# Patient Record
Sex: Male | Born: 1945 | Race: White | Hispanic: No | Marital: Single | Smoking: Never smoker
Health system: Southern US, Community
[De-identification: ages and names within clinical notes are randomized; demographics above are authoritative.]

## PROBLEM LIST (undated history)

## (undated) DIAGNOSIS — I10 Essential (primary) hypertension: Secondary | ICD-10-CM

---

## 2003-09-22 ENCOUNTER — Inpatient Hospital Stay (HOSPITAL_COMMUNITY): Admission: RE | Admit: 2003-09-22 | Discharge: 2003-09-26 | Payer: Self-pay | Admitting: Orthopedic Surgery

## 2003-10-27 ENCOUNTER — Inpatient Hospital Stay (HOSPITAL_COMMUNITY): Admission: RE | Admit: 2003-10-27 | Discharge: 2003-10-31 | Payer: Self-pay | Admitting: Orthopedic Surgery

## 2004-10-06 ENCOUNTER — Ambulatory Visit (HOSPITAL_BASED_OUTPATIENT_CLINIC_OR_DEPARTMENT_OTHER): Admission: RE | Admit: 2004-10-06 | Discharge: 2004-10-06 | Payer: Self-pay | Admitting: Orthopedic Surgery

## 2004-10-06 ENCOUNTER — Ambulatory Visit (HOSPITAL_COMMUNITY): Admission: RE | Admit: 2004-10-06 | Discharge: 2004-10-06 | Payer: Self-pay | Admitting: Orthopedic Surgery

## 2009-05-28 ENCOUNTER — Encounter: Admission: RE | Admit: 2009-05-28 | Discharge: 2009-05-28 | Payer: Self-pay | Admitting: Gastroenterology

## 2011-03-25 NOTE — Op Note (Signed)
NAME:  Eddie Roberts, Eddie Roberts NO.:  [id number]   MEDICAL RECORD NO.:  [id number]          PATIENT TYPE:  AMB   LOCATION:  DSC                          FACILITY:  [facility]   PHYSICIAN:  Loreta Ave, M.D. DATE OF BIRTH:  07/28/1946   DATE OF PROCEDURE:  10/06/2004  DATE OF DISCHARGE:                                 OPERATIVE REPORT   PREOPERATIVE DIAGNOSIS:  Left ankle degenerative arthritis, chondromalacia,  loose bodies, spurring, and synovitis.  Mild to moderate instability.   POSTOPERATIVE DIAGNOSIS:  Left ankle degenerative arthritis, chondromalacia,  loose bodies, spurring, and synovitis.  Mild to moderate instability.   PROCEDURE:  Left ankle examination under anesthesia, arthroscopy, removal of  loose bodies, partial synovectomy, removal of tibial spurs.  Chondroplasty  of all compartments.   SURGEON:  Loreta Ave, M.D.   ASSISTANT:  Genene Churn. Denton Meek.   ANESTHESIA:  General.   ESTIMATED BLOOD LOSS:  Minimal.   SPECIMENS:  None.   CULTURES:  None.   COMPLICATIONS:  None.   DRESSING:  Soft compressive.   DESCRIPTION OF PROCEDURE:  The patient was brought to the operating room and  after adequate anesthesia had been obtained, a tourniquet was applied and  the stirrup for ankle arthroscopy applied.  Prepped and draped in the usual  sterile fashion.  Exsanguinated with elevation of Esmarch.  Tourniquet  inflated to 350 mmHg.  Anterolateral and anteromedial portals at the ankle.  Ankle was examined.  Some anterior impingement limiting dorsiflexion not  extreme.  Moderate instability of opening about 10 to 11 degrees, certainly  not extreme and not loose enough to require reconstruction.  After the two  portals were created, the ankle was entered with the arthroscope, distended,  and inspected.  Diffuse grade 2 changes.  Some focal grade 3 changes lateral  tibia and the lateral dome of the talus.  All of this was chondroplastied  throughout.   Three large osteochondral loose bodies, one lateral and two  medial were all found and completely removed.  Partially tethered medially  and this was freed up to remove the loose bodies.  Synovitis throughout  especially at the syndesmosis all debrided.  The ankle was then examined and  all recess examined.  All loose bodies had been removed.  All cartilage  surfaces even.  Some impingement and spurring distal tibia anteriorly with  maximum dorsiflexion.  Spur was removed with a bur, tapered down smoothly  with a shaver.  All impingement obliterated.  At completion, the entire  ankle examined and no other findings appreciated.  I looked at all recesses  including way posteriorly and I saw no other loose fragments.  Instruments  and fluid removed.  Ankle injected with Marcaine.  Portals closed with nylon.  Sterile  compressive dressing applied.  Tourniquet inflated and removed.  Anesthesia  reversed.  Brought to the recovery room.  Tolerated the surgery well with no  complications.      Valentino Saxon   DFM/MEDQ  D:  10/06/2004  T:  10/06/2004  Job:  [id number]

## 2011-03-25 NOTE — Discharge Summary (Signed)
NAME:  Eddie Roberts, MANI NO.:  [id number]   MEDICAL RECORD NO.:  [id number]                   PATIENT TYPE:  INP   LOCATION:  5031                                 FACILITY:  [facility]   PHYSICIAN:  Loreta Ave, M.D.              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   PROCEDURE:  Right total knee replacement.   HISTORY:  This is a 65 year old male who is status post left total knee  replacement.  He is having worsening right knee pain with activities of  daily living as well as at nighttime.  He has radiographic end-stage DJD of  his right medial compartment.  He is now indicated for right total knee  replacement.   HOSPITAL COURSE:  This is a 65 year old male who was admitted on October 27, 2003 after appropriate laboratory studies were obtained as well as 1  gram of Ancef IV in the operating room.  He was taken to the operating room  where he underwent a right total knee replacement.  He tolerated the  procedure well.  He was continued on Ancef 1 gram IV q. 8 h times three  doses.  Heparin 5000 units subcutaneous q. 12 hours was begun until his  Coumadin became therapeutic.  Foley was placed intraoperatively.  Consultations with PT and OT were made.  Weightbearing as tolerated on the  right.  CPM 0 to 30 degrees for eight to ten hours per day and this was  increased 10 degrees a day.  Hemovacs were placed intraoperatively.  On the  21st, the CPM was placed from 0 to 90 degrees.  PT was ordered to do range  of motion and strengthening on the left knee.  He was allowed out of bed to  a chair the following day.  His dressing was changed on the 22nd of  December.  Hemovacs were pulled at that  time.  Foley was discontinued.  The  remainder of his hospital course was uneventful.  He was placed on OxyContin  CR 20 mg p.o. q. 12 hours on the 23rd of December.  He was discharged on the  24th of December to follow back up with [place] in 10 to 12 days for recheck  evaluation and staple removal.   Radiographic studies revealed satisfactory position and alignment of a right  total knee replacement.  Preoperative laboratory studies; hemoglobin 14.0,  hematocrit 41.2%, white count 7,200, platelets 290,000.  Discharge  hemoglobin 8.8, hematocrit 25.9%, white count 7,300, platelets 201,000.  Protime at time of discharge was 17.4 with an INR of 1.7.  Preoperative  sodium 141, potassium 4.3, chloride 103, CO2  29, glucose 97, BUN 10,  creatinine 1.0, calcium 9.5, total protein 7.5, albumin 3.8, AST 17, ALT 13,  ALP 91, total bilirubin 0.5.  Discharge sodium 139, potassium 4.2, chloride  105, CO2 29, glucose 135, BUN 9, creatinine 0.9, calcium 8.6.  Urinalysis  was benign for voided urine, though there were 0:2 red cells and calcium  oxalate crystals were noted.  Blood type was O-.  Antibody screen negative.   DISCHARGE INSTRUCTIONS:  He was given a prescription for OxyContin CR 20 mg  one p.o. q. 12 hours, Percocet 5/325 one to two every four hours as needed  for pain, Coumadin 5 mg as directed by advanced home therapy, Colace 100 mg  twice a day, iron sulfate 325 mg twice a day.  Activity as per physical  therapy.  No restrictions on diet.  He will keep his wound clean and dry and  covered with a dressing daily.  Follow blue  instruction sheet.  CPM on the left six hours per day starting at 90  degrees.  CPM on the right eight to ten hours a day increasing 10 degrees a  day.  He will follow back up with [place] in 10 to 12 days for a recheck  evaluation.  Discharged in improved condition.      Oris Drone Petrarca, P.A.-C.                Loreta Ave, M.D.    BDP/MEDQ  D:  12/15/2003  T:   12/16/2003  Job:  [id number]

## 2011-03-25 NOTE — Op Note (Signed)
NAME:  Eddie Roberts, Eddie Roberts NO.:  [id number]   MEDICAL RECORD NO.:  [id number]                   PATIENT TYPE:  INP   LOCATION:  5031                                 FACILITY:  [facility]   PHYSICIAN:  Loreta Ave, M.D.              DATE OF BIRTH:  1946-08-29   DATE OF PROCEDURE:  10/27/2003  DATE OF DISCHARGE:                                 OPERATIVE REPORT   PREOPERATIVE DIAGNOSIS:  End stage degenerative arthritis, right knee with  varus alignment, mild flexion contracture.   POSTOPERATIVE DIAGNOSIS:  End stage degenerative arthritis, right knee with  varus alignment, mild flexion contracture.   OPERATION PERFORMED:  Right total knee replacement with soft tissue  balancing.  Cemented #11 posterior stabilized femoral component.  Cemented  #11 tibial component with 12 mm posterior stabilized flexed insert.  Cemented recessed medialized patellar components, 30 mm.   SURGEON:  Loreta Ave, M.D.   ASSISTANT:  Arlys John D. Petrarca, P.A.-C.   ANESTHESIA:  General.   ESTIMATED BLOOD LOSS:  Minimal.   TOURNIQUET TIME:  One hour 20 minutes.   SPECIMENS:  Excised bone and soft tissue.   CULTURES:  None.   COMPLICATIONS:  None.   DRAINS:  Hemovac times 2.   DRESSING:  Soft compressive with knee immobilizer.   DESCRIPTION OF PROCEDURE:  The patient was brought to the operating room and  placed on the operating table in supine position.  After adequate anesthesia  had been obtained, both knees examined.  Recent total knee on the left.  This was gently manipulated to aid with therapy.  He had full extension,  nicely balanced knee and with some gentle manipulation, we got him back past  120 degrees of flexion.  Attention was then turned to the right.  5 degree  flexion contracture, alignment 5 degrees varus, correctable to neutral.  Further flexion to 100 degrees.  Tourniquet applied.  Leg prepped and draped  in the usual sterile fashion.   Exsanguinated with elevation and Esmarch.  Tourniquet inflated to 350 mmHg.  Anterior incision above the patella down  to the tibial tubercle. Ski and subcutaneous tissues divided.  Hemostasis  with cautery.  Medial parapatellar arthrotomy.  Clear effusion.  Knee  exposed.  Grade 4 changes medially with a little bit lesser extent in the  other compartments.  Remnants of menisci, cruciate ligaments removed as well  as hypertrophic synovial tissue.  Medial capsular release.  Distal femur  exposed.  Intramedullary  guide placed.  Distal cut set at 5 degrees of  valgus  removing 10 mm.  Sized to a #11 component.  Jigs put in place,  definitive cuts made.  Trial put in place and found to fit well.  Attention  was turned to the tibia.  Tibial spine removed with a saw.  The  intramedullary guide placed.  Proximal cut removing 6 mm, 5 degree posterior  slope cut with the intramedullary guide.  Patella sized, reamed and drilled  for the 30 mm component.  Because of his increased Q-angle, I medialized  patellar component to improve tracking.  Trials put in place throughout.  A  #11 on the femur, #11 on the tibia, #30 mm on the patella.  With a 12 mm  tibial insert, full extension, full flexion, nicely balanced knee set at 5  degrees of valgus, with good stability and no lift off in flexion.  The  tibia was marked for appropriate rotation and hand reamed.  All trials  removed.  Knee was copiously irrigated with a pulse irrigating device.  Cement prepared and placed on all components which were firmly seated and  polyethylene attached to the tibial component.  Knee reduced.  Once the  cement had hardened, the knee was re-examined.  Full extension, full  flexion, good patellofemoral tracking, good stability.  Hemovacs placed,  brought out through separate stab wounds.  Arthrotomy closed with #1 Vicryl.  Skin and subcutaneous tissue withy Vicryl and staples.  Margins of the wound  and the knee injected  with Marcaine and Hemovac clamped. Sterile compressive  dressing applied.  Anesthesia reversed.  Brought to recovery room.  Tolerated surgery well.  No complications.                                               Loreta Ave, M.D.    DFM/MEDQ  D:  10/27/2003  T:  10/27/2003  Job:  [id number]

## 2011-03-25 NOTE — Op Note (Signed)
NAME:  Eddie Roberts, SKODA NO.:  [id number]   MEDICAL RECORD NO.:  [id number]                   PATIENT TYPE:  INP   LOCATION:  5004                                 FACILITY:  [facility]   PHYSICIAN:  Loreta Ave, M.D.              DATE OF BIRTH:  01/16/1946   DATE OF PROCEDURE:  09/24/2003  DATE OF DISCHARGE:                                 OPERATIVE REPORT   PREOPERATIVE DIAGNOSIS:  End stage degenerative arthritis, varus alignment,  and flexion contracture, left knee.   POSTOPERATIVE DIAGNOSIS:  End stage degenerative arthritis, varus alignment,  and flexion contracture, left knee.   OPERATIVE PROCEDURE:  Left total knee replacement Osteonix prosthesis.  Soft  tissue balancing including lateral retinacular release and medial capsule  release.  Cemented number 11 posterior stabilized femoral component.  Cemented number 11 tibial component with 12 mm posterior stabilized flexed  polyethylene insert.  Cemented recessed 30 mm patellar component.   SURGEON:  Loreta Ave, M.D.   ASSISTANT:  Arlys John D. Petrarca, P.A.-C.   ANESTHESIA:  General.   ESTIMATED BLOOD LOSS:  Minimal.   TOURNIQUET TIME:  1 hour 20 minutes.   SPECIMENS:  Excised bone and soft tissue.   CULTURES:  None.   COMPLICATIONS:  None.   DRAINS:  Hemovac x 1.   PROCEDURE:  The patient was brought to the operating room and after adequate  anesthesia had been obtained, the left knee was examined.  More than 5  degree flexion contracture, more than 5 degrees of varus without much  correction, further flexion 100 degrees, stable ligaments.  Tourniquet  applied, prepped and draped in the usual sterile fashion.  Exsanguination  with elevation of Esmarch and tourniquet inflated to 350 mmHg.  A straight  incision above the patella down to the tibial tubercle.  Medial parapatellar  arthrotomy.  Hemostasis cautery.  Medial capsular release.  Grade 4 changes  throughout.  Remnants of  menisci, cruciate ligaments, periarticular spurs,  loose bodies, all removed.  Distal femur exposed.  Intramedullary guide  placed.  Distal cut removing 10 mm set at 5 mm valgus.  Sized for number 11  component.  Jigs put in place and definitive cuts made.  Tibial spine  removed with a saw.  Proximal tibial cut removing 6 mm off the deficient  side, 5 degree posterior slope cut with intramedullary guide.  Sized for  number 11 component.  Patella sized, reamed, and drilled for a 30 mm  component.  Trials put in place throughout.  With the 12 mm polyethylene  insert, I had good stability, good alignment set at 5 degrees of valgus  after medial capsular release.  Full extension and full flexion without lift  off.  Lateral release necessary to balance the patellofemoral joint and once  performed, I had good tracking.  All trials were removed after the tibia was  marked for appropriate rotation.  The tibia was hand reamed.  Copious  irrigation with the pulse irrigating device.  Cement prepared and placed on  all components which were firmly seated.  Polyethylene attached to the tibia  and the knee reduced. Once the cement hardened, it was re-examined.  Full  extension, full flexion, no lift off, good patellofemoral tracking, good  stability.  Two Hemovacs placed and brought out through separate stab  wounds.  Arthrotomy closed with #1 Vicryl, skin and subcutaneous tissue with  Vicryl and staples.  Margins of the wound and knee injected with Marcaine  and a Hemovac placed.  Sterile compressive dressing applied.  Tourniquet  deflated and removed.  Knee immobilizer applied.  Anesthesia reversed.  Brought to the recovery room.  Tolerated the surgery well without  complications.                                               Loreta Ave, M.D.    DFM/MEDQ  D:  09/24/2003  T:  09/24/2003  Job:  [id number]

## 2011-03-25 NOTE — Discharge Summary (Signed)
NAME:  Eddie Roberts, Eddie Roberts NO.:  [id number]   MEDICAL RECORD NO.:  [id number]                   PATIENT TYPE:  INP   LOCATION:  5004                                 FACILITY:  [facility]   PHYSICIAN:  Loreta Ave, M.D.              DATE OF BIRTH:  [birth year]   DATE OF ADMISSION:  09/22/2003  DATE OF DISCHARGE:  09/26/2003                                 DISCHARGE SUMMARY   ADMISSION DIAGNOSIS:  Advanced degenerative joint disease of the left knee.   DISCHARGE DIAGNOSIS:  Advanced degenerative joint disease of the left knee.   PROCEDURE:  Left total knee replacement.   HISTORY:  A 65 year old male with advanced DJD of the knee with progressive  pain, stiffness, and deformity. He has been tried with conservative  treatment including cortisone injections with only transient relief. He is  not having pains with activities of daily living. He is indicated to right  total knee replacement secondary to advanced DJD of the knee.   HOSPITAL COURSE:  A 65 year old male admitted September 22, 2003 after  appropriate laboratory studies were obtained. Was taken to the operating  room where he underwent a left total knee replacement. He was placed  preoperatively on Ancef 1 g IV and postoperative 1 g IV q.8h. for three  doses. Heparin 5,000 units subcu q.12h. was begun until his Coumadin level  became therapeutic. A PCA Dilaudid high dose protocol PCA pump was used.  Consults with PT and OT. Ambulation weight bearing as tolerated. CPM 0 to 30  degrees 8 to 10 hours per day. Foley was placed intraoperatively. Hemovac  drains were placed intraoperatively. His Hemovac had been released four  hours afterwards and apparently had a fairly large blood output. His Hemovac  was reclamped. Hemoglobin A1c was ordered. He was allowed out of bed to  chair the following day. His dressings were changed on November 17, and his  Hemovac drains were pulled. He had an otherwise  unremarkable hospital  course, and he was discharged home on November 19 with guaiac stool cards  that he was to obtain and bring to the office. Discharged in improved  condition. EKG was normal sinus rhythm.   LABORATORY DATA:  Admitted with a hemoglobin of 14.5, hematocrit 43.3%,  white count 6,900, platelets 246,000. Discharge hemoglobin 10.0, hematocrit  29.3%, white count 6,700, platelets 171,000. Discharge protime 19.5 with an  INR of 2.1. Preoperative chemistries:  Sodium 138, potassium 4.5, chloride  104, CO2 27, glucose 94, BUN 8, creatinine 1.0; calcium 9.1, total protein  6.5, albumin 3.9, AST 15, ALT 14, ALP 62, total bilirubin 0.5. Discharge  sodium 138, potassium 3.8, chloride 102, CO2 31, glucose 128, BUN 6,  creatinine 0.9, and calcium 8.2. hemoglobin A1c is 5.2. Urinalysis:  Benign  for a void urine. Blood type O negative, antibody screen negative.   DISCHARGE INSTRUCTIONS:  1. Given a prescription for  Percocet 5/325 one to two tablets every four     hours as needed for pain.  2. Coumadin 5 mg one and a half tablets on Monday, Wednesday, Friday, and     Sunday; one tablet on Tuesday, Saturday, and Thursday.  3. Iron sulfate 325 mg b.i.d. with food.  4. Colace 100 mg twice a day.   ACTIVITY:  As tolerated with physical therapy.   DIET:  No restrictions in diet.   WOUND CARE:  Keep his wounds clean and dry and cover daily with a dressing.  Follow blue instruction sheet.   FOLLOW UP:  Follow back up in the office in the 10 to 14 days for recheck  evaluation.   CONDITION ON DISCHARGE:  Discharged in improved condition.      Oris Drone Petrarca, P.A.-C.                Loreta Ave, M.D.    BDP/MEDQ  D:  10/20/2003  T:  10/21/2003  Job:  [id number]

## 2014-03-22 ENCOUNTER — Ambulatory Visit (INDEPENDENT_AMBULATORY_CARE_PROVIDER_SITE_OTHER): Payer: Medicare Other | Admitting: Internal Medicine

## 2014-03-22 ENCOUNTER — Ambulatory Visit: Payer: Medicare Other

## 2014-03-22 VITALS — BP 140/80 | HR 97 | Temp 100.6°F | Ht 71.0 in | Wt 295.0 lb

## 2014-03-22 DIAGNOSIS — R51 Headache: Secondary | ICD-10-CM

## 2014-03-22 DIAGNOSIS — R509 Fever, unspecified: Secondary | ICD-10-CM

## 2014-03-22 DIAGNOSIS — J320 Chronic maxillary sinusitis: Secondary | ICD-10-CM

## 2014-03-22 LAB — POCT CBC
Granulocyte percent: 84.5 %G — AB (ref 37–80)
HCT, POC: 40.2 % — AB (ref 43.5–53.7)
Hemoglobin: 12.7 g/dL — AB (ref 14.1–18.1)
Lymph, poc: 1 (ref 0.6–3.4)
MCH, POC: 29.6 pg (ref 27–31.2)
MCHC: 31.6 g/dL — AB (ref 31.8–35.4)
MCV: 93.8 fL (ref 80–97)
MID (cbc): 0.6 (ref 0–0.9)
MPV: 7.4 fL (ref 0–99.8)
POC Granulocyte: 8.4 — AB (ref 2–6.9)
POC LYMPH PERCENT: 9.8 %L — AB (ref 10–50)
POC MID %: 5.7 %M (ref 0–12)
Platelet Count, POC: 304 10*3/uL (ref 142–424)
RBC: 4.29 M/uL — AB (ref 4.69–6.13)
RDW, POC: 14 %
WBC: 9.9 10*3/uL (ref 4.6–10.2)

## 2014-03-22 LAB — POCT URINALYSIS DIPSTICK
Bilirubin, UA: NEGATIVE
Glucose, UA: NEGATIVE
Leukocytes, UA: NEGATIVE
Nitrite, UA: NEGATIVE
Protein, UA: 30
Spec Grav, UA: 1.02
Urobilinogen, UA: 1
pH, UA: 6

## 2014-03-22 LAB — POCT UA - MICROSCOPIC ONLY
Bacteria, U Microscopic: NEGATIVE
Casts, Ur, LPF, POC: NEGATIVE
Crystals, Ur, HPF, POC: NEGATIVE
Yeast, UA: NEGATIVE

## 2014-03-22 LAB — GLUCOSE, POCT (MANUAL RESULT ENTRY): POC Glucose: 121 mg/dl — AB (ref 70–99)

## 2014-03-22 MED ORDER — DOXYCYCLINE HYCLATE 100 MG PO TABS: 100.0000 mg | ORAL_TABLET | Freq: Two times a day (BID) | ORAL | Status: AC

## 2014-03-22 MED ORDER — CEFTRIAXONE SODIUM 1 G IJ SOLR
1.0000 g | Freq: Once | INTRAMUSCULAR | Status: AC
  Administered 2014-03-22: 1 g via INTRAMUSCULAR

## 2014-03-22 NOTE — Patient Instructions (Addendum)
Fever, Adult A fever is a higher than normal body temperature. In an adult, an oral temperature around 98.6 F (37 C) is considered normal. A temperature of 100.4 F (38 C) or higher is generally considered a fever. Mild or moderate fevers generally have no long-term effects and often do not require treatment. Extreme fever (greater than or equal to 106 F or 41.1 C) can cause seizures. The sweating that may occur with repeated or prolonged fever may cause dehydration. Elderly people can develop confusion during a fever. A measured temperature can vary with:  Age.  Time of day.  Method of measurement (mouth, underarm, rectal, or ear). The fever is confirmed by taking a temperature with a thermometer. Temperatures can be taken different ways. Some methods are accurate and some are not.  An oral temperature is used most commonly. Electronic thermometers are fast and accurate.  An ear temperature will only be accurate if the thermometer is positioned as recommended by the manufacturer.  A rectal temperature is accurate and done for those adults who have a condition where an oral temperature cannot be taken.  An underarm (axillary) temperature is not accurate and not recommended. Fever is a symptom, not a disease.  CAUSES   Infections commonly cause fever.  Some noninfectious causes for fever include:  Some arthritis conditions.  Some thyroid or adrenal gland conditions.  Some immune system conditions.  Some types of cancer.  A medicine reaction.  High doses of certain street drugs such as methamphetamine.  Dehydration.  Exposure to high outside or room temperatures.  Occasionally, the source of a fever cannot be determined. This is sometimes called a "fever of unknown origin" (FUO).  Some situations may lead to a temporary rise in body temperature that may go away on its own. Examples are:  Childbirth.  Surgery.  Intense exercise. HOME CARE INSTRUCTIONS   Take  appropriate medicines for fever. Follow dosing instructions carefully. If you use acetaminophen to reduce the fever, be careful to avoid taking other medicines that also contain acetaminophen. Do not take aspirin for a fever if you are younger than age 19. There is an association with Reye's syndrome. Reye's syndrome is a rare but potentially deadly disease.  If an infection is present and antibiotics have been prescribed, take them as directed. Finish them even if you start to feel better.  Rest as needed.  Maintain an adequate fluid intake. To prevent dehydration during an illness with prolonged or recurrent fever, you may need to drink extra fluid.Drink enough fluids to keep your urine clear or pale yellow.  Sponging or bathing with room temperature water may help reduce body temperature. Do not use ice water or alcohol sponge baths.  Dress comfortably, but do not over-bundle. SEEK MEDICAL CARE IF:   You are unable to keep fluids down.  You develop vomiting or diarrhea.  You are not feeling at least partly better after 3 days.  You develop new symptoms or problems. SEEK IMMEDIATE MEDICAL CARE IF:   You have shortness of breath or trouble breathing.  You develop excessive weakness.  You are dizzy or you faint.  You are extremely thirsty or you are making little or no urine.  You develop new pain that was not there before (such as in the head, neck, chest, back, or abdomen).  You have persistant vomiting and diarrhea for more than 1 to 2 days.  You develop a stiff neck or your eyes become sensitive to light.  You develop a   skin rash.  You have a fever or persistent symptoms for more than 2 to 3 days.  You have a fever and your symptoms suddenly get worse. MAKE SURE YOU:   Understand these instructions.  Will watch your condition.  Will get help right away if you are not doing well or get worse. Document Released: 04/19/2001 Document Revised: 01/16/2012 Document  Reviewed: 08/25/2011 [facility] Patient Information 2014 [place], [place]. [place] Spotted Fever [place] Spotted Fever (RMSF) is the oldest known tick-borne disease of people in the [place]. This disease was named because it was first described among people in the [facility] area who had an illness characterized by a rash with red-purple-black spots. This disease is caused by a rickettsia (Rickettsia rickettsii), a bacteria carried by the tick. [facility] wood tick and the American dog tick, acquire and transmit the RMSF bacteria (pictures NOT actual size). When a larval, nymphal or adult tick feeds on an infected rodent or larger animal, the tick can become infected. Infected adult ticks then feed on people who may then get RMSF. The tick transmits the disease to humans during a prolonged period of feeding that lasts many hours, days or even a couple weeks. The bite is painless and frequently goes unnoticed. An infected male tick may also pass the rickettsial bacteria to her eggs that then may mature to be infected adult ticks. The rickettsia that causes RMSF can also get into a person's body through damaged skin. A tick bite is not necessary. People can get RMSF if they crush a tick and get it's blood or body fluids on their skin through a small cut or sore.  DIAGNOSIS Diagnosis is made by laboratory tests.  TREATMENT Treatment is with antibiotics (medications that kill rickettsia and other bacteria). Immediate treatment usually prevents death. GEOGRAPHIC RANGE This disease was reported only in the [facility] until 1931. RMSF has more recently been described among individuals in all states except [place], [place] and [place]. The highest reported incidences of RMSF now occur among residents of [place], [place], [place] and the [place]. TIME OF YEAR  Most cases are diagnosed during late spring and summer when ticks are most active. However, especially in the warmer  [place] states, a few cases occur during the winter. SYMPTOMS  Symptoms of RMSF begin from 2 to 14 days after a tick bite. The most common early symptoms are fever, muscle aches and headache followed by nausea (feeling sick to your stomach) or vomiting. The RMSF rash is typically delayed until 3 or more days after symptom onset, and eventually develops in 9 of 10 infected patients by the 5th day of illness. If the disease is not treated it can cause death. If you get a fever, headache, muscle aches, rash, nausea or vomiting within 2 weeks of a possible tick bite or exposure you should see your caregiver immediately. PREVENTION Ticks prefer to hide in shady, moist ground litter. They can often be found above the ground clinging to tall grass, brush, shrubs and low tree branches. They also inhabit lawns and gardens, especially at the edges of woodlands and around old stone walls. Within the areas where ticks generally live, no naturally vegetated area can be considered completely free of infected ticks. The best precaution against RMSF is to avoid contact with soil, leaf litter and vegetation as much as possible in tick infested areas. For those who enjoy gardening or walking in their yards, clear brush and mow tall grass around houses and  at the edges of gardens. This may help reduce the tick population in the immediate area. Applications of chemical insecticides by a licensed professional in the spring (late May) and Fall (September) will also control ticks, especially in heavily infested areas. Treatment will never get rid of all the ticks. Getting rid of small animal populations that host ticks will also decrease the tick population. When working in the garden, [facility], or handling soil and vegetation, wear light-colored protective clothing and gloves. Spot-check often to prevent ticks from reaching the skin. Ticks cannot jump or fly. They will not drop from an above-ground perch onto a passing  animal. Once a tick gains access to human skin it climbs upward until it reaches a more protected area. For example, the back of the knee, groin, navel, armpit, ears or nape of the neck. It then begins the slow process of embedding itself in the skin. Campers, hikers, field workers, and others who spend time in wooded, brushy or tall grassy areas can avoid exposure to ticks by using the following precautions: Wear light-colored clothing with a tight weave to spot ticks more easily and prevent contact with the skin. Wear long pants tucked into socks, long-sleeved shirts tucked into pants and enclosed shoes or boots along with insect repellent. Spray clothes with insect repellent containing either DEET or Permethrin. Only DEET can be used on exposed skin. Follow the manufacturer's directions carefully. Wear a hat and keep long hair pulled back. Stay on cleared, well-worn trails whenever possible. Spot-check yourself and others often for the presence of ticks on clothes. If you find one, there are likely to be others. Check thoroughly. Remove clothes after leaving tick-infested areas. If possible, wash them to eliminate any unseen ticks. Check yourself, your children and any pets from head to toe for the presence of ticks. Shower and shampoo. You can greatly reduce your chances of contracting RMSF if you remove attached ticks as soon as possible. Regular checks of the body, including all body sites covered by hair (head, armpits, genitals), allow removal of the tick before rickettsial transmission. To remove an attached tick, use a forceps or tweezers to detach the intact tick without leaving mouth parts in the skin. The tick bite wound should be cleansed after tick removal. Remember the most common symptoms of RMSF are fever, muscle aches, headache and nausea or vomiting with a later onset of rash. If you get these symptoms after a tick bite and while living in an area where RMSF is found, RMSF should be  suspected. If the disease is not treated, it can cause death. See your caregiver immediately if you get these symptoms. Do this even if not aware of a tick bite. Document Released: 02/05/2001 Document Revised: 01/16/2012 Document Reviewed: 09/28/2009 [facility] Patient Information 2014 [place], [place]. Sinusitis Sinusitis is redness, soreness, and swelling (inflammation) of the paranasal sinuses. Paranasal sinuses are air pockets within the bones of your face (beneath the eyes, the middle of the forehead, or above the eyes). In healthy paranasal sinuses, mucus is able to drain out, and air is able to circulate through them by way of your nose. However, when your paranasal sinuses are inflamed, mucus and air can become trapped. This can allow bacteria and other germs to grow and cause infection. Sinusitis can develop quickly and last only a short time (acute) or continue over a long period (chronic). Sinusitis that lasts for more than 12 weeks is considered chronic.  CAUSES  Causes of sinusitis include: Allergies.  Structural abnormalities, such as displacement of the cartilage that separates your nostrils (deviated septum), which can decrease the air flow through your nose and sinuses and affect sinus drainage. Functional abnormalities, such as when the small hairs (cilia) that line your sinuses and help remove mucus do not work properly or are not present. SYMPTOMS  Symptoms of acute and chronic sinusitis are the same. The primary symptoms are pain and pressure around the affected sinuses. Other symptoms include: Upper toothache. Earache. Headache. Bad breath. Decreased sense of smell and taste. A cough, which worsens when you are lying flat. Fatigue. Fever. Thick drainage from your nose, which often is green and may contain pus (purulent). Swelling and warmth over the affected sinuses. DIAGNOSIS  Your caregiver will perform a physical exam. During the exam, your caregiver may: Look in your  nose for signs of abnormal growths in your nostrils (nasal polyps). Tap over the affected sinus to check for signs of infection. View the inside of your sinuses (endoscopy) with a special imaging device with a light attached (endoscope), which is inserted into your sinuses. If your caregiver suspects that you have chronic sinusitis, one or more of the following tests may be recommended: Allergy tests. Nasal culture A sample of mucus is taken from your nose and sent to a lab and screened for bacteria. Nasal cytology A sample of mucus is taken from your nose and examined by your caregiver to determine if your sinusitis is related to an allergy. TREATMENT  Most cases of acute sinusitis are related to a viral infection and will resolve on their own within 10 days. Sometimes medicines are prescribed to help relieve symptoms (pain medicine, decongestants, nasal steroid sprays, or saline sprays).  However, for sinusitis related to a bacterial infection, your caregiver will prescribe antibiotic medicines. These are medicines that will help kill the bacteria causing the infection.  Rarely, sinusitis is caused by a fungal infection. In theses cases, your caregiver will prescribe antifungal medicine. For some cases of chronic sinusitis, surgery is needed. Generally, these are cases in which sinusitis recurs more than 3 times per year, despite other treatments. HOME CARE INSTRUCTIONS  Drink plenty of water. Water helps thin the mucus so your sinuses can drain more easily. Use a humidifier. Inhale steam 3 to 4 times a day (for example, sit in the bathroom with the shower running). Apply a warm, moist washcloth to your face 3 to 4 times a day, or as directed by your caregiver. Use saline nasal sprays to help moisten and clean your sinuses. Take over-the-counter or prescription medicines for pain, discomfort, or fever only as directed by your caregiver. SEEK IMMEDIATE MEDICAL CARE IF: You have increasing pain  or severe headaches. You have nausea, vomiting, or drowsiness. You have swelling around your face. You have vision problems. You have a stiff neck. You have difficulty breathing. MAKE SURE YOU:  Understand these instructions. Will watch your condition. Will get help right away if you are not doing well or get worse. Document Released: 10/24/2005 Document Revised: 01/16/2012 Document Reviewed: 11/08/2011 [facility] Patient Information 2014 [place], [place].

## 2014-03-22 NOTE — Progress Notes (Signed)
Subjective:    Patient ID: Eddie Roberts, male    DOB: [birth year], 68 y.o.   MRN: [id number]  HPI  68 year old caucasian male presents to [facility] with fever of 102+ since Sunday.  Pt admits to problem with constipation with relief on Tuesday.   Pt denies sore throat, denies cough, congestion or nasal symptoms.  Pt admits to frontal headache.  Pt feels he has similar symptoms of a sinus infection that happened a few years ago.  Pt admits to seasonal allergies.  Does have pets.  Pt has no rash or chest pain, no cough, no shortness of breath, no diarrhea, no vomiting.  No frequency, urgency or burning with urination.   Pt has checked himself for ticks when he has been outside in the park.  Pt admitted to abdominal pain with the constipation but feels those symptoms have subsided with relief of constipation.  Date of LBM was this am and normal, pain free.  Pt admits to intentional weight loss from 320 lbs one year ago and today weighed 295 lbs.       Review of Systems     Objective:   Physical Exam  Constitutional: He is oriented to person, place, and time. He appears well-nourished. No distress.  HENT:  Head: Normocephalic.  Right Ear: External ear normal.  Left Ear: External ear normal.  Nose: Mucosal edema and rhinorrhea present. No sinus tenderness. Right sinus exhibits no maxillary sinus tenderness and no frontal sinus tenderness. Left sinus exhibits no maxillary sinus tenderness and no frontal sinus tenderness.  Mouth/Throat: Oropharynx is clear and moist.  Eyes: Conjunctivae and EOM are normal. Pupils are equal, round, and reactive to light.  Neck: Normal range of motion. Neck supple.  Cardiovascular: Normal rate, regular rhythm and normal heart sounds.   Pulmonary/Chest: Effort normal and breath sounds normal.  Abdominal: Soft. Bowel sounds are normal. He exhibits no distension. There is no tenderness. There is no rebound and no guarding.  Musculoskeletal: Normal range  of motion.  Lymphadenopathy:    He has no cervical adenopathy.  Neurological: He is alert and oriented to person, place, and time. No cranial nerve deficit. He exhibits normal muscle tone. Coordination normal.  Skin: Skin is warm and dry. No rash noted.  Psychiatric: He has a normal mood and affect. His behavior is normal. Judgment and thought content normal.   Appears well  [facility] reading (PRIMARY) by  Dr Elder Cyphers right maxillary clouding  Consider sinusitis or tic fever Results for orders placed in visit on 03/22/14  POCT URINALYSIS DIPSTICK      Result Value Ref Range   Color, UA yellow     Clarity, UA clear     Glucose, UA neg     Bilirubin, UA neg     Ketones, UA trace     Spec Grav, UA 1.020     Blood, UA trace     pH, UA 6.0     Protein, UA 30     Urobilinogen, UA 1.0     Nitrite, UA neg     Leukocytes, UA Negative    POCT UA - MICROSCOPIC ONLY      Result Value Ref Range   WBC, Ur, HPF, POC 0-3     RBC, urine, microscopic 0-4     Bacteria, U Microscopic neg     Mucus, UA large     Epithelial cells, urine per micros 0-2     Crystals, Ur, HPF, POC neg  Casts, Ur, LPF, POC neg     Yeast, UA neg    GLUCOSE, POCT (MANUAL RESULT ENTRY)      Result Value Ref Range   POC Glucose 121 (*) 70 - 99 mg/dl  POCT CBC      Result Value Ref Range   WBC 9.9  4.6 - 10.2 K/uL   Lymph, poc 1.0  0.6 - 3.4   POC LYMPH PERCENT 9.8 (*) 10 - 50 %L   MID (cbc) 0.6  0 - 0.9   POC MID % 5.7  0 - 12 %M   POC Granulocyte 8.4 (*) 2 - 6.9   Granulocyte percent 84.5 (*) 37 - 80 %G   RBC 4.29 (*) 4.69 - 6.13 M/uL   Hemoglobin 12.7 (*) 14.1 - 18.1 g/dL   HCT, POC 40.2 (*) 43.5 - 53.7 %   MCV 93.8  80 - 97 fL   MCH, POC 29.6  27 - 31.2 pg   MCHC 31.6 (*) 31.8 - 35.4 g/dL   RDW, POC 14.0     Platelet Count, POC 304  142 - 424 K/uL   MPV 7.4  0 - 99.8 fL        Assessment & Plan:  Maxillary sinusitis Cover tick fever Rocephin 1g/Doxycycline 100mg  bid

## 2014-03-25 LAB — ROCKY MTN SPOTTED FVR AB, IGM-BLOOD: ROCKY MTN SPOTTED FEVER, IGM: 0.31 IV

## 2014-03-27 ENCOUNTER — Encounter: Payer: Self-pay | Admitting: *Deleted

## 2015-02-06 ENCOUNTER — Other Ambulatory Visit: Payer: Self-pay | Admitting: Physician Assistant

## 2015-02-06 DIAGNOSIS — R1011 Right upper quadrant pain: Secondary | ICD-10-CM

## 2015-02-06 DIAGNOSIS — R17 Unspecified jaundice: Secondary | ICD-10-CM

## 2015-02-09 ENCOUNTER — Ambulatory Visit
Admission: RE | Admit: 2015-02-09 | Discharge: 2015-02-09 | Disposition: A | Discharge status: Home / Self Care | Payer: Medicare Other | Source: Ambulatory Visit | Attending: Physician Assistant | Admitting: Physician Assistant

## 2015-02-09 DIAGNOSIS — R1011 Right upper quadrant pain: Secondary | ICD-10-CM

## 2015-02-09 DIAGNOSIS — R17 Unspecified jaundice: Secondary | ICD-10-CM

## 2015-02-11 ENCOUNTER — Other Ambulatory Visit: Payer: Self-pay | Admitting: Physician Assistant

## 2015-02-11 DIAGNOSIS — R1011 Right upper quadrant pain: Secondary | ICD-10-CM

## 2015-02-12 ENCOUNTER — Ambulatory Visit
Admission: RE | Admit: 2015-02-12 | Discharge: 2015-02-12 | Disposition: A | Discharge status: Home / Self Care | Payer: Medicare Other | Source: Ambulatory Visit | Attending: Physician Assistant | Admitting: Physician Assistant

## 2015-02-12 DIAGNOSIS — R1011 Right upper quadrant pain: Secondary | ICD-10-CM

## 2015-02-12 MED ORDER — IOPAMIDOL (ISOVUE-300) INJECTION 61%
125.0000 mL | Freq: Once | INTRAVENOUS | Status: AC | PRN
  Administered 2015-02-12: 125 mL via INTRAVENOUS

## 2015-02-13 ENCOUNTER — Telehealth: Payer: Self-pay | Admitting: *Deleted

## 2015-02-13 ENCOUNTER — Inpatient Hospital Stay (HOSPITAL_COMMUNITY)
Admission: EM | Admit: 2015-02-13 | Discharge: 2015-02-19 | DRG: 435 | Disposition: A | Discharge status: Other Acute Inpatient Hospital | Payer: Medicare Other | Attending: Internal Medicine | Admitting: Internal Medicine

## 2015-02-13 ENCOUNTER — Emergency Department (HOSPITAL_COMMUNITY): Payer: Medicare Other

## 2015-02-13 ENCOUNTER — Encounter (HOSPITAL_COMMUNITY): Payer: Self-pay | Admitting: Emergency Medicine

## 2015-02-13 DIAGNOSIS — Z79899 Other long term (current) drug therapy: Secondary | ICD-10-CM

## 2015-02-13 DIAGNOSIS — Z87891 Personal history of nicotine dependence: Secondary | ICD-10-CM | POA: Diagnosis not present

## 2015-02-13 DIAGNOSIS — K838 Other specified diseases of biliary tract: Secondary | ICD-10-CM

## 2015-02-13 DIAGNOSIS — C801 Malignant (primary) neoplasm, unspecified: Secondary | ICD-10-CM | POA: Diagnosis present

## 2015-02-13 DIAGNOSIS — R160 Hepatomegaly, not elsewhere classified: Secondary | ICD-10-CM | POA: Diagnosis not present

## 2015-02-13 DIAGNOSIS — Z6837 Body mass index (BMI) 37.0-37.9, adult: Secondary | ICD-10-CM

## 2015-02-13 DIAGNOSIS — R740 Nonspecific elevation of levels of transaminase and lactic acid dehydrogenase [LDH]: Secondary | ICD-10-CM | POA: Diagnosis present

## 2015-02-13 DIAGNOSIS — R7989 Other specified abnormal findings of blood chemistry: Secondary | ICD-10-CM | POA: Diagnosis not present

## 2015-02-13 DIAGNOSIS — R17 Unspecified jaundice: Secondary | ICD-10-CM

## 2015-02-13 DIAGNOSIS — C787 Secondary malignant neoplasm of liver and intrahepatic bile duct: Principal | ICD-10-CM | POA: Diagnosis present

## 2015-02-13 DIAGNOSIS — R918 Other nonspecific abnormal finding of lung field: Secondary | ICD-10-CM

## 2015-02-13 DIAGNOSIS — M549 Dorsalgia, unspecified: Secondary | ICD-10-CM | POA: Diagnosis present

## 2015-02-13 DIAGNOSIS — C229 Malignant neoplasm of liver, not specified as primary or secondary: Secondary | ICD-10-CM | POA: Diagnosis not present

## 2015-02-13 DIAGNOSIS — D630 Anemia in neoplastic disease: Secondary | ICD-10-CM | POA: Diagnosis present

## 2015-02-13 DIAGNOSIS — C7800 Secondary malignant neoplasm of unspecified lung: Secondary | ICD-10-CM | POA: Diagnosis present

## 2015-02-13 DIAGNOSIS — K831 Obstruction of bile duct: Secondary | ICD-10-CM | POA: Diagnosis present

## 2015-02-13 DIAGNOSIS — R945 Abnormal results of liver function studies: Secondary | ICD-10-CM

## 2015-02-13 DIAGNOSIS — I81 Portal vein thrombosis: Secondary | ICD-10-CM

## 2015-02-13 DIAGNOSIS — C249 Malignant neoplasm of biliary tract, unspecified: Secondary | ICD-10-CM

## 2015-02-13 DIAGNOSIS — I10 Essential (primary) hypertension: Secondary | ICD-10-CM | POA: Diagnosis present

## 2015-02-13 DIAGNOSIS — R935 Abnormal findings on diagnostic imaging of other abdominal regions, including retroperitoneum: Secondary | ICD-10-CM

## 2015-02-13 DIAGNOSIS — C221 Intrahepatic bile duct carcinoma: Secondary | ICD-10-CM

## 2015-02-13 DIAGNOSIS — E876 Hypokalemia: Secondary | ICD-10-CM | POA: Diagnosis present

## 2015-02-13 DIAGNOSIS — E43 Unspecified severe protein-calorie malnutrition: Secondary | ICD-10-CM | POA: Diagnosis present

## 2015-02-13 DIAGNOSIS — C799 Secondary malignant neoplasm of unspecified site: Secondary | ICD-10-CM | POA: Diagnosis present

## 2015-02-13 HISTORY — DX: Essential (primary) hypertension: I10

## 2015-02-13 LAB — COMPREHENSIVE METABOLIC PANEL
ALT: 251 U/L — ABNORMAL HIGH (ref 0–53)
AST: 186 U/L — ABNORMAL HIGH (ref 0–37)
Albumin: 3.1 g/dL — ABNORMAL LOW (ref 3.5–5.2)
Alkaline Phosphatase: 777 U/L — ABNORMAL HIGH (ref 39–117)
Anion gap: 12 (ref 5–15)
BUN: 11 mg/dL (ref 6–23)
CO2: 24 mmol/L (ref 19–32)
Calcium: 8.9 mg/dL (ref 8.4–10.5)
Chloride: 102 mmol/L (ref 96–112)
Creatinine, Ser: 0.58 mg/dL (ref 0.50–1.35)
GFR calc Af Amer: >90 mL/min (ref >90)
GFR calc non Af Amer: >90 mL/min (ref >90)
Glucose, Bld: 123 mg/dL — ABNORMAL HIGH (ref 70–99)
Potassium: 3.1 mmol/L — ABNORMAL LOW (ref 3.5–5.1)
Sodium: 138 mmol/L (ref 135–145)
Total Bilirubin: 13 mg/dL — ABNORMAL HIGH (ref 0.3–1.2)
Total Protein: 6.7 g/dL (ref 6.0–8.3)

## 2015-02-13 LAB — CBC WITH DIFFERENTIAL/PLATELET
Basophils Absolute: 0 10*3/uL (ref 0.0–0.1)
Basophils Relative: 0 % (ref 0–1)
Eosinophils Absolute: 0.3 10*3/uL (ref 0.0–0.7)
Eosinophils Relative: 3 % (ref 0–5)
HCT: 37 % — ABNORMAL LOW (ref 39.0–52.0)
Hemoglobin: 12.1 g/dL — ABNORMAL LOW (ref 13.0–17.0)
Lymphocytes Relative: 9 % — ABNORMAL LOW (ref 12–46)
Lymphs Abs: 0.8 10*3/uL (ref 0.7–4.0)
MCH: 30.4 pg (ref 26.0–34.0)
MCHC: 32.7 g/dL (ref 30.0–36.0)
MCV: 93 fL (ref 78.0–100.0)
Monocytes Absolute: 0.7 10*3/uL (ref 0.1–1.0)
Monocytes Relative: 7 % (ref 3–12)
Neutro Abs: 7.8 10*3/uL — ABNORMAL HIGH (ref 1.7–7.7)
Neutrophils Relative %: 81 % — ABNORMAL HIGH (ref 43–77)
Platelets: 351 10*3/uL (ref 150–400)
RBC: 3.98 MIL/uL — ABNORMAL LOW (ref 4.22–5.81)
RDW: 16.8 % — ABNORMAL HIGH (ref 11.5–15.5)
WBC: 9.6 10*3/uL (ref 4.0–10.5)

## 2015-02-13 LAB — PROTIME-INR
INR: 1.07 (ref 0.00–1.49)
Prothrombin Time: 14 seconds (ref 11.6–15.2)

## 2015-02-13 LAB — APTT: aPTT: 33 seconds (ref 24–37)

## 2015-02-13 LAB — LIPASE, BLOOD: Lipase: 110 U/L — ABNORMAL HIGH (ref 11–59)

## 2015-02-13 LAB — I-STAT CG4 LACTIC ACID, ED: Lactic Acid, Venous: 1.19 mmol/L (ref 0.5–2.0)

## 2015-02-13 MED ORDER — MORPHINE SULFATE 2 MG/ML IJ SOLN
2.0000 mg | INTRAMUSCULAR | Status: DC | PRN
  Administered 2015-02-13 – 2015-02-19 (×11): 2 mg via INTRAVENOUS
  Filled 2015-02-13 (×11): qty 1

## 2015-02-13 MED ORDER — HEPARIN SODIUM (PORCINE) 5000 UNIT/ML IJ SOLN: 5000.0000 [IU] | Freq: Three times a day (TID) | INTRAMUSCULAR | Status: DC

## 2015-02-13 MED ORDER — HEPARIN (PORCINE) IN NACL 100-0.45 UNIT/ML-% IJ SOLN
1700.0000 [IU]/h | INTRAMUSCULAR | Status: DC
  Administered 2015-02-13: 1700 [IU]/h via INTRAVENOUS
  Filled 2015-02-13 (×3): qty 250

## 2015-02-13 MED ORDER — HEPARIN BOLUS VIA INFUSION
5000.0000 [IU] | Freq: Once | INTRAVENOUS | Status: AC
  Administered 2015-02-13: 5000 [IU] via INTRAVENOUS
  Filled 2015-02-13: qty 5000

## 2015-02-13 NOTE — H&P (Signed)
[facility] History and Physical  DEKARI BURES VZC:[id number] DOB: 10/26/1946 DOA: 02/13/2015  Referring physician: EDP PCP: Fae Pippin   Chief Complaint: Weakness, anorexia, jaundice   HPI: Eddie Roberts is a 69 y.o. male who presents to the ED with c/o 1 week history of generalized weakness, anorexia, worsening jaundice, abdominal pain.  Symptoms are worsening since onset a week ago.  He had been feeling "unwell" for a couple of months but didn't see a doctor until he started developing jaundice a week ago.  Yesterday the cause of his symptoms was revealed during CT scan abd/pelvis in the [facility] (findings documented below).  Review of Systems: Systems reviewed.  As above, otherwise negative  Past Medical History  Diagnosis Date  . Hypertension    History reviewed. No pertinent past surgical history. Social History:  reports that he has never smoked. He does not have any smokeless tobacco history on file. He reports that he does not drink alcohol or use illicit drugs.  No Known Allergies  No family history on file.   Prior to Admission medications   Medication Sig Start Date End Date Taking? Authorizing Provider  acetaminophen (TYLENOL) 650 MG CR tablet Take 1,300 mg by mouth at bedtime.   Yes Historical Provider, MD  amLODipine (NORVASC) 2.5 MG tablet Take 2.5 mg by mouth at bedtime.   Yes Historical Provider, MD  Ascorbic Acid (VITAMIN C) 100 MG tablet Take 100 mg by mouth daily.   Yes Historical Provider, MD  Doxylamine Succinate, Sleep, (SLEEP AID PO) Take 2-3 tablets by mouth at bedtime.   Yes Historical Provider, MD  Multiple Vitamin (MULTIVITAMIN) tablet Take 1 tablet by mouth daily.   Yes Historical Provider, MD  doxycycline (VIBRA-TABS) 100 MG tablet Take 1 tablet (100 mg total) by mouth 2 (two) times daily. Patient not taking: Reported on 02/13/2015 03/22/14   Orma Flaming, MD   Physical Exam: Filed Vitals:   02/13/15 1935  BP: 140/79  Pulse: 88   Temp:   Resp: 14    BP 140/79 mmHg  Pulse 88  Temp(Src) 97.8 F (36.6 C) (Oral)  Resp 14  Ht 6\' 1"  (1.854 m)  Wt 129.729 kg (286 lb)  BMI 37.74 kg/m2  SpO2 95%  General Appearance:    Alert, oriented, no distress, appears stated age  Head:    Normocephalic, atraumatic  Eyes:    PERRL, EOMI, jaundiced.      Nose:   Nares without drainage or epistaxis. Mucosa, turbinates normal  Throat:   Moist mucous membranes. Oropharynx without erythema or exudate.  Neck:   Supple. No carotid bruits.  No thyromegaly.  No lymphadenopathy.   Back:     No CVA tenderness, no spinal tenderness  Lungs:     Clear to auscultation bilaterally, without wheezes, rhonchi or rales  Chest wall:    No tenderness to palpitation  Heart:    Regular rate and rhythm without murmurs, gallops, rubs  Abdomen:     Soft, non-tender, nondistended, normal bowel sounds, no organomegaly  Genitalia:    deferred  Rectal:    deferred  Extremities:   No clubbing, cyanosis or edema.  Pulses:   2+ and symmetric all extremities  Skin:   Skin color, texture, turgor normal, no rashes or lesions  Lymph nodes:   Cervical, supraclavicular, and axillary nodes normal  Neurologic:   CNII-XII intact. Normal strength, sensation and reflexes      throughout    Labs on Admission:  Basic Metabolic Panel:  Recent Labs Lab 02/13/15 1801  NA 138  K 3.1*  CL 102  CO2 24  GLUCOSE 123*  BUN 11  CREATININE 0.58  CALCIUM 8.9   Liver Function Tests:  Recent Labs Lab 02/13/15 1801  AST 186*  ALT 251*  ALKPHOS 777*  BILITOT 13.0*  PROT 6.7  ALBUMIN 3.1*    Recent Labs Lab 02/13/15 1801  LIPASE 110*   No results for input(s): AMMONIA in the last 168 hours. CBC:  Recent Labs Lab 02/13/15 1801  WBC 9.6  NEUTROABS 7.8*  HGB 12.1*  HCT 37.0*  MCV 93.0  PLT 351   Cardiac Enzymes: No results for input(s): CKTOTAL, CKMB, CKMBINDEX, TROPONINI in the last 168 hours.  BNP (last 3 results) No results for input(s):  PROBNP in the last 8760 hours. CBG: No results for input(s): GLUCAP in the last 168 hours.  Radiological Exams on Admission: Ct Abdomen Pelvis W Wo Contrast  02/12/2015   CLINICAL DATA:  69 year old male with right upper quadrant abdominal pain since February 2016. Elevated AST and ALT, with jaundice. Dark urine. Evaluate for potential malignancy.  EXAM: CT ABDOMEN AND PELVIS WITHOUT AND WITH CONTRAST  TECHNIQUE: Multidetector CT imaging of the abdomen and pelvis was performed following the standard protocol before and following the bolus administration of intravenous contrast.  CONTRAST:  125 mL of Isovue-300.  COMPARISON:  No priors.  Abdominal ultrasound 02/09/2015.  FINDINGS: Lower chest: 3.0 x 3.6 x 3.0 cm right lower lobe mass (image 12 of series 9). Multiple other smaller pulmonary nodules scattered throughout the lungs bilaterally. Trace bilateral pleural effusions layering dependently. Right hilar lymphadenopathy measuring up to 1.5 x 2.0 cm (image 4 of series 5). Multiple borderline enlarged and mildly enlarged juxta phrenic lymph nodes bilaterally measuring up to 1 cm in short axis adjacent to the right hemidiaphragm.  Hepatobiliary: Moderate intrahepatic biliary ductal dilatation. Common bile duct does not appear dilated, but is poorly visualized. Gallbladder is largely decompressed. Gallbladder wall appears thickened and edematous, measuring up to 1 cm in thickness. No definite gallstones. Hypoenhancement throughout the left lobe of the liver, presumably related to left portal vein thrombosis (see discussion below). Ill-defined soft tissue in the hepatic hilum adjacent to the centrally dilated bile ducts, concerning for an ill-defined infiltrative mass such as a cholangiocarcinoma. Some of this tissue appears to extend caudally into the porta hepatis coming in close contact to the pancreatic head, best appreciated on images 63 through 66 of series 5.  Pancreas: No definite pancreatic mass. However,  the tail of the pancreas appears slightly more bulbous than the rest of the gland, and appears to be slightly hypoenhancing compared to the rest of the gland. No pancreatic ductal dilatation. No pancreatic or peripancreatic fluid or inflammatory changes.  Spleen: Unremarkable.  Adrenals/Urinary Tract: Tiny nonobstructive calculi in the collecting systems of the kidneys bilaterally, largest of which measures 5 mm in the lower pole collecting system of the left kidney. No ureteral stones or findings of urinary tract obstruction are noted at this time. No suspicious appearing cystic or solid renal lesions. Urinary bladder is normal in appearance.  Stomach/Bowel: The appearance of the stomach is normal. No pathologic dilatation of small bowel or colon. Normal appendix.  Vascular/Lymphatic: Main portal vein is mildly dilated measuring 17 mm in diameter. The main portal vein and right branches of the portal vein are patent. The left branch of portal vein is not visualized, and appears to be thrombosed. Atherosclerosis throughout  the abdominal and pelvic vasculature, with mild aneurysmal dilatation of the right common iliac artery which measures up to 1.8 cm in diameter. Enlarged hepatoduodenal ligament lymph node measuring up to 12 mm in short axis. Several borderline enlarged and mildly enlarged retroperitoneal lymph nodes are noted, largest of which measure up to 12 mm in short axis in the upper retroperitoneum in the para-aortic and aortocaval nodal stations.  Reproductive: Prostate gland and seminal vesicles are unremarkable in appearance.  Other: Small volume of ascites predominantly in the low anatomic pelvis. No pneumoperitoneum. Small umbilical hernia containing omental fat.  Musculoskeletal: There are no aggressive appearing lytic or blastic lesions noted in the visualized portions of the skeleton.  IMPRESSION: 1. Highly unusual appearance of the liver, with moderate to severe intrahepatic biliary ductal  dilatation, with poorly defined soft tissue in the central aspect of the liver, concerning for potential infiltrative neoplasm such ass cholangiocarcinoma. Correlation with MRI/MRCP with and without IV gadolinium is recommended. 2. Hepatoduodenal ligament lymphadenopathy and retroperitoneal lymphadenopathy, concerning for metastatic disease. 3. Left portal vein thrombosis with hypoperfusion throughout the left lobe of the liver. 4. The tail of the pancreas appears slightly enlarged, and enhances to a lesser extent than the remainder of the head and body of the pancreas. No discrete mass is identified. Attention to this region at time of follow-up MRI is recommended to exclude the possibility of a subtle infiltrative neoplasm. 5. Innumerable small pulmonary nodules, and larger right lower lobe pulmonary mass measuring 3.0 x 3.6 x 3.0 cm. Findings are favored to reflect metastatic disease to the lungs, however, the possibility of a primary right lower lobe bronchogenic neoplasm is not excluded. As part of the workup for the patient's underlying primary malignancy and to define the full extent of metastatic disease, these findings could be further evaluated with PET-CT and/or biopsy if clinically appropriate. 6. Additional incidental findings, as above.   Electronically Signed   By: Vinnie Langton M.D.   On: 02/12/2015 15:25   Dg Thoracic Spine 2 View  02/13/2015   CLINICAL DATA:  Thoracic pain. Unknown primary malignancy with likely lung metastases. No injury.  EXAM: THORACIC SPINE - 2 VIEW  COMPARISON:  None.  FINDINGS: Vertebral body alignment, heights and disc spaces are within normal. There is mild spondylosis throughout the thoracic spine. Pedicles are intact. There is no compression fracture or subluxation.  IMPRESSION: Mild spondylosis.  No acute findings.   Electronically Signed   By: Marin Olp M.D.   On: 02/13/2015 17:26   Ct Head Wo Contrast  02/13/2015   CLINICAL DATA:  Headaches.  Undiagnosed  primary malignancy.  EXAM: CT HEAD WITHOUT CONTRAST  TECHNIQUE: Contiguous axial images were obtained from the base of the skull through the vertex without intravenous contrast.  COMPARISON:  None.  FINDINGS: Ventricles, cisterns and other CSF spaces are within normal. There is chronic ischemic microvascular disease. There is no mass, mass effect, shift of midline structures or acute hemorrhage. No evidence to suggest acute infarction. Mild bulbous prominence to the basilar tip as cannot exclude a small aneurysm. Near complete opacification of the right maxillary sinus. Remaining bony structures are within normal.  IMPRESSION: No acute intracranial findings.  Chronic ischemic microvascular disease.  Bulbous prominence to the basilar artery tip as cannot exclude a small aneurysm. Consider CTA versus MRA for further evaluation.  Moderate chronic versus acute sinus inflammatory disease involving the right maxillary sinus.   Electronically Signed   By: Marin Olp M.D.  On: 02/13/2015 17:36    EKG: Independently reviewed.  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 Patient with what clinically would appear to be metastatic cancer: ddx for primary includes: pancreatic, cholangiocarcinoma, HCC, NSCLC, and others 1. Obstructive jaundice due to what appears to be a bile duct mass at the top of his bile duct, has distention of intrahepatic ducts, gallbladder and below is decompressed. 1. Spoke with Dr. Michail Sermon of GI who will see patient in consult tomorrow morning 2. NPO after midnight per Dr. Michail Sermon 3. Sounds like patient needs a biliary stent per Dr. Michail Sermon "if we can get one up that high" 4. Repeat CMP in AM 2. Also has portal vein thrombosis 1. Heparin per pharm consult ordered 3. Lung mass - 3cm lung mass in R lung, also has numerous  pulmonary nodules bilaterally, per report favored to reflect metastatic disease, although primary RLL neoplasm is not excluded 4. Ill defined density at tail of pancreas 5. Hopefully to obtain tissue biopsy if possible during the stent placement ERCP planned by Dr. Michail Sermon, if not then will have to explore other biopsy options.    Code Status: Full Code  Family Communication: Wife at bedside Disposition Plan: Admit to inpatient   Time spent: 32 min  GARDNER, JARED M. [facility] Pager [phone]  If 7AM-7PM, please contact the day team taking care of the patient [email or web address] Password [facility] 02/13/2015, 8:55 PM

## 2015-02-13 NOTE — ED Provider Notes (Signed)
CSN: [id number]     Arrival date & time 02/13/15  1549 History   First MD Initiated Contact with Patient 02/13/15 1623     Chief Complaint  Patient presents with  . high bilirubin   . headaches      (Consider location/radiation/quality/duration/timing/severity/associated sxs/prior Treatment) HPI Comments: Pt's wife states that pt been sick for several months. Pt has high bilirubin and had ct scan yesterday and was called last night and was told think its cancer and was told to come to ED. Pt states that he has headaches.   Patient is a 69 y.o. male presenting with back pain.  Back Pain Location:  Thoracic spine Quality:  Aching Radiates to:  Does not radiate Pain severity:  Moderate Pain is:  Same all the time Onset quality:  Gradual Timing:  Constant Progression:  Unchanged Chronicity:  New Relieved by:  Nothing Worsened by:  Nothing tried Ineffective treatments:  None tried Associated symptoms: no abdominal pain, no chest pain, no dysuria, no fever, no headaches, no numbness and no weakness   Associated symptoms comment:  Fatigue, malaise, headaches, jaundice   Past Medical History  Diagnosis Date  . Hypertension    History reviewed. No pertinent past surgical history. No family history on file. History  Substance Use Topics  . Smoking status: Never Smoker   . Smokeless tobacco: Not on file  . Alcohol Use: No    Review of Systems  Constitutional: Negative for fever, activity change, appetite change and fatigue.  HENT: Negative for congestion, facial swelling, rhinorrhea and trouble swallowing.   Eyes: Negative for photophobia and pain.  Respiratory: Negative for cough, chest tightness and shortness of breath.   Cardiovascular: Negative for chest pain and leg swelling.  Gastrointestinal: Negative for nausea, vomiting, abdominal pain, diarrhea and constipation.  Endocrine: Negative for polydipsia and polyuria.  Genitourinary: Negative for dysuria, urgency,  decreased urine volume and difficulty urinating.  Musculoskeletal: Positive for back pain. Negative for gait problem.  Skin: Negative for color change, rash and wound.  Allergic/Immunologic: Negative for immunocompromised state.  Neurological: Negative for dizziness, facial asymmetry, speech difficulty, weakness, numbness and headaches.  Psychiatric/Behavioral: Negative for confusion, decreased concentration and agitation.      Allergies  Review of patient's allergies indicates no known allergies.  Home Medications   Prior to Admission medications   Medication Sig Start Date End Date Taking? Authorizing Provider  acetaminophen (TYLENOL) 650 MG CR tablet Take 1,300 mg by mouth at bedtime.   Yes Historical Provider, MD  amLODipine (NORVASC) 2.5 MG tablet Take 2.5 mg by mouth at bedtime.   Yes Historical Provider, MD  Ascorbic Acid (VITAMIN C) 100 MG tablet Take 100 mg by mouth daily.   Yes Historical Provider, MD  Doxylamine Succinate, Sleep, (SLEEP AID PO) Take 2-3 tablets by mouth at bedtime.   Yes Historical Provider, MD  Multiple Vitamin (MULTIVITAMIN) tablet Take 1 tablet by mouth daily.   Yes Historical Provider, MD  doxycycline (VIBRA-TABS) 100 MG tablet Take 1 tablet (100 mg total) by mouth 2 (two) times daily. Patient not taking: Reported on 02/13/2015 03/22/14   Benn Moulder Guest, MD   BP 134/64 mmHg  Pulse 95  Temp(Src) 97.6 F (36.4 C) (Oral)  Resp 16  Ht 6\' 1"  (1.854 m)  Wt 286 lb (129.729 kg)  BMI 37.74 kg/m2  SpO2 96% Physical Exam  Constitutional: He is oriented to person, place, and time. He appears well-developed and well-nourished. No distress.  HENT:  Head: Normocephalic and  atraumatic.  Mouth/Throat: No oropharyngeal exudate.  Eyes: Pupils are equal, round, and reactive to light.  Neck: Normal range of motion. Neck supple.  Cardiovascular: Normal rate, regular rhythm and normal heart sounds.  Exam reveals no gallop and no friction rub.   No murmur  heard. Pulmonary/Chest: Effort normal and breath sounds normal. No respiratory distress. He has no wheezes. He has no rales.  Abdominal: Soft. Bowel sounds are normal. He exhibits distension and ascites. He exhibits no mass. There is no tenderness. There is no rebound and no guarding.  Musculoskeletal: Normal range of motion. He exhibits no edema or tenderness.  Neurological: He is alert and oriented to person, place, and time. He has normal strength. He displays no atrophy and no tremor. No cranial nerve deficit or sensory deficit. He exhibits normal muscle tone. He displays a negative Romberg sign. Coordination and gait normal. GCS eye subscore is 4. GCS verbal subscore is 5. GCS motor subscore is 6.  Skin: Skin is warm and dry.  jaundice  Psychiatric: He has a normal mood and affect.    ED Course  Procedures (including critical care time) Labs Review Labs Reviewed  CBC WITH DIFFERENTIAL/PLATELET - Abnormal; Notable for the following:    RBC 3.98 (*)    Hemoglobin 12.1 (*)    HCT 37.0 (*)    RDW 16.8 (*)    Neutrophils Relative % 81 (*)    Neutro Abs 7.8 (*)    Lymphocytes Relative 9 (*)    All other components within normal limits  COMPREHENSIVE METABOLIC PANEL - Abnormal; Notable for the following:    Potassium 3.1 (*)    Glucose, Bld 123 (*)    Albumin 3.1 (*)    AST 186 (*)    ALT 251 (*)    Alkaline Phosphatase 777 (*)    Total Bilirubin 13.0 (*)    All other components within normal limits  LIPASE, BLOOD - Abnormal; Notable for the following:    Lipase 110 (*)    All other components within normal limits  PROTIME-INR  COMPREHENSIVE METABOLIC PANEL  CBC  APTT  HEPARIN LEVEL (UNFRACTIONATED)  I-STAT CG4 LACTIC ACID, ED    Imaging Review Ct Abdomen Pelvis W Wo Contrast  02/12/2015   CLINICAL DATA:  69 year old male with right upper quadrant abdominal pain since February 2016. Elevated AST and ALT, with jaundice. Dark urine. Evaluate for potential malignancy.  EXAM: CT  ABDOMEN AND PELVIS WITHOUT AND WITH CONTRAST  TECHNIQUE: Multidetector CT imaging of the abdomen and pelvis was performed following the standard protocol before and following the bolus administration of intravenous contrast.  CONTRAST:  125 mL of Isovue-300.  COMPARISON:  No priors.  Abdominal ultrasound 02/09/2015.  FINDINGS: Lower chest: 3.0 x 3.6 x 3.0 cm right lower lobe mass (image 12 of series 9). Multiple other smaller pulmonary nodules scattered throughout the lungs bilaterally. Trace bilateral pleural effusions layering dependently. Right hilar lymphadenopathy measuring up to 1.5 x 2.0 cm (image 4 of series 5). Multiple borderline enlarged and mildly enlarged juxta phrenic lymph nodes bilaterally measuring up to 1 cm in short axis adjacent to the right hemidiaphragm.  Hepatobiliary: Moderate intrahepatic biliary ductal dilatation. Common bile duct does not appear dilated, but is poorly visualized. Gallbladder is largely decompressed. Gallbladder wall appears thickened and edematous, measuring up to 1 cm in thickness. No definite gallstones. Hypoenhancement throughout the left lobe of the liver, presumably related to left portal vein thrombosis (see discussion below). Ill-defined soft tissue in the  hepatic hilum adjacent to the centrally dilated bile ducts, concerning for an ill-defined infiltrative mass such as a cholangiocarcinoma. Some of this tissue appears to extend caudally into the porta hepatis coming in close contact to the pancreatic head, best appreciated on images 63 through 66 of series 5.  Pancreas: No definite pancreatic mass. However, the tail of the pancreas appears slightly more bulbous than the rest of the gland, and appears to be slightly hypoenhancing compared to the rest of the gland. No pancreatic ductal dilatation. No pancreatic or peripancreatic fluid or inflammatory changes.  Spleen: Unremarkable.  Adrenals/Urinary Tract: Tiny nonobstructive calculi in the collecting systems of the  kidneys bilaterally, largest of which measures 5 mm in the lower pole collecting system of the left kidney. No ureteral stones or findings of urinary tract obstruction are noted at this time. No suspicious appearing cystic or solid renal lesions. Urinary bladder is normal in appearance.  Stomach/Bowel: The appearance of the stomach is normal. No pathologic dilatation of small bowel or colon. Normal appendix.  Vascular/Lymphatic: Main portal vein is mildly dilated measuring 17 mm in diameter. The main portal vein and right branches of the portal vein are patent. The left branch of portal vein is not visualized, and appears to be thrombosed. Atherosclerosis throughout the abdominal and pelvic vasculature, with mild aneurysmal dilatation of the right common iliac artery which measures up to 1.8 cm in diameter. Enlarged hepatoduodenal ligament lymph node measuring up to 12 mm in short axis. Several borderline enlarged and mildly enlarged retroperitoneal lymph nodes are noted, largest of which measure up to 12 mm in short axis in the upper retroperitoneum in the para-aortic and aortocaval nodal stations.  Reproductive: Prostate gland and seminal vesicles are unremarkable in appearance.  Other: Small volume of ascites predominantly in the low anatomic pelvis. No pneumoperitoneum. Small umbilical hernia containing omental fat.  Musculoskeletal: There are no aggressive appearing lytic or blastic lesions noted in the visualized portions of the skeleton.  IMPRESSION: 1. Highly unusual appearance of the liver, with moderate to severe intrahepatic biliary ductal dilatation, with poorly defined soft tissue in the central aspect of the liver, concerning for potential infiltrative neoplasm such ass cholangiocarcinoma. Correlation with MRI/MRCP with and without IV gadolinium is recommended. 2. Hepatoduodenal ligament lymphadenopathy and retroperitoneal lymphadenopathy, concerning for metastatic disease. 3. Left portal vein  thrombosis with hypoperfusion throughout the left lobe of the liver. 4. The tail of the pancreas appears slightly enlarged, and enhances to a lesser extent than the remainder of the head and body of the pancreas. No discrete mass is identified. Attention to this region at time of follow-up MRI is recommended to exclude the possibility of a subtle infiltrative neoplasm. 5. Innumerable small pulmonary nodules, and larger right lower lobe pulmonary mass measuring 3.0 x 3.6 x 3.0 cm. Findings are favored to reflect metastatic disease to the lungs, however, the possibility of a primary right lower lobe bronchogenic neoplasm is not excluded. As part of the workup for the patient's underlying primary malignancy and to define the full extent of metastatic disease, these findings could be further evaluated with PET-CT and/or biopsy if clinically appropriate. 6. Additional incidental findings, as above.   Electronically Signed   By: Vinnie Langton M.D.   On: 02/12/2015 15:25   Dg Thoracic Spine 2 View  02/13/2015   CLINICAL DATA:  Thoracic pain. Unknown primary malignancy with likely lung metastases. No injury.  EXAM: THORACIC SPINE - 2 VIEW  COMPARISON:  None.  FINDINGS: Vertebral body  alignment, heights and disc spaces are within normal. There is mild spondylosis throughout the thoracic spine. Pedicles are intact. There is no compression fracture or subluxation.  IMPRESSION: Mild spondylosis.  No acute findings.   Electronically Signed   By: Marin Olp M.D.   On: 02/13/2015 17:26   Ct Head Wo Contrast  02/13/2015   CLINICAL DATA:  Headaches.  Undiagnosed primary malignancy.  EXAM: CT HEAD WITHOUT CONTRAST  TECHNIQUE: Contiguous axial images were obtained from the base of the skull through the vertex without intravenous contrast.  COMPARISON:  None.  FINDINGS: Ventricles, cisterns and other CSF spaces are within normal. There is chronic ischemic microvascular disease. There is no mass, mass effect, shift of midline  structures or acute hemorrhage. No evidence to suggest acute infarction. Mild bulbous prominence to the basilar tip as cannot exclude a small aneurysm. Near complete opacification of the right maxillary sinus. Remaining bony structures are within normal.  IMPRESSION: No acute intracranial findings.  Chronic ischemic microvascular disease.  Bulbous prominence to the basilar artery tip as cannot exclude a small aneurysm. Consider CTA versus MRA for further evaluation.  Moderate chronic versus acute sinus inflammatory disease involving the right maxillary sinus.   Electronically Signed   By: Marin Olp M.D.   On: 02/13/2015 17:36     EKG Interpretation None     CLINICAL DATA: 69 year old male with right upper quadrant abdominal pain since February 2016. Elevated AST and ALT, with jaundice. Dark urine. Evaluate for potential malignancy.  EXAM: CT ABDOMEN AND PELVIS WITHOUT AND WITH CONTRAST  TECHNIQUE: Multidetector CT imaging of the abdomen and pelvis was performed following the standard protocol before and following the bolus administration of intravenous contrast.  CONTRAST: 125 mL of Isovue-300.  COMPARISON: No priors. Abdominal ultrasound 02/09/2015.  FINDINGS: Lower chest: 3.0 x 3.6 x 3.0 cm right lower lobe mass (image 12 of series 9). Multiple other smaller pulmonary nodules scattered throughout the lungs bilaterally. Trace bilateral pleural effusions layering dependently. Right hilar lymphadenopathy measuring up to 1.5 x 2.0 cm (image 4 of series 5). Multiple borderline enlarged and mildly enlarged juxta phrenic lymph nodes bilaterally measuring up to 1 cm in short axis adjacent to the right hemidiaphragm.  Hepatobiliary: Moderate intrahepatic biliary ductal dilatation. Common bile duct does not appear dilated, but is poorly visualized. Gallbladder is largely decompressed. Gallbladder wall appears thickened and edematous, measuring up to 1 cm in thickness.  No definite gallstones. Hypoenhancement throughout the left lobe of the liver, presumably related to left portal vein thrombosis (see discussion below). Ill-defined soft tissue in the hepatic hilum adjacent to the centrally dilated bile ducts, concerning for an ill-defined infiltrative mass such as a cholangiocarcinoma. Some of this tissue appears to extend caudally into the porta hepatis coming in close contact to the pancreatic head, best appreciated on images 63 through 66 of series 5.  Pancreas: No definite pancreatic mass. However, the tail of the pancreas appears slightly more bulbous than the rest of the gland, and appears to be slightly hypoenhancing compared to the rest of the gland. No pancreatic ductal dilatation. No pancreatic or peripancreatic fluid or inflammatory changes.  Spleen: Unremarkable.  Adrenals/Urinary Tract: Tiny nonobstructive calculi in the collecting systems of the kidneys bilaterally, largest of which measures 5 mm in the lower pole collecting system of the left kidney. No ureteral stones or findings of urinary tract obstruction are noted at this time. No suspicious appearing cystic or solid renal lesions. Urinary bladder is normal in appearance.  Stomach/Bowel: The appearance of the stomach is normal. No pathologic dilatation of small bowel or colon. Normal appendix.  Vascular/Lymphatic: Main portal vein is mildly dilated measuring 17 mm in diameter. The main portal vein and right branches of the portal vein are patent. The left branch of portal vein is not visualized, and appears to be thrombosed. Atherosclerosis throughout the abdominal and pelvic vasculature, with mild aneurysmal dilatation of the right common iliac artery which measures up to 1.8 cm in diameter. Enlarged hepatoduodenal ligament lymph node measuring up to 12 mm in short axis. Several borderline enlarged and mildly enlarged retroperitoneal lymph nodes are noted, largest  of which measure up to 12 mm in short axis in the upper retroperitoneum in the para-aortic and aortocaval nodal stations.  Reproductive: Prostate gland and seminal vesicles are unremarkable in appearance.  Other: Small volume of ascites predominantly in the low anatomic pelvis. No pneumoperitoneum. Small umbilical hernia containing omental fat.  Musculoskeletal: There are no aggressive appearing lytic or blastic lesions noted in the visualized portions of the skeleton.  IMPRESSION: 1. Highly unusual appearance of the liver, with moderate to severe intrahepatic biliary ductal dilatation, with poorly defined soft tissue in the central aspect of the liver, concerning for potential infiltrative neoplasm such ass cholangiocarcinoma. Correlation with MRI/MRCP with and without IV gadolinium is recommended. 2. Hepatoduodenal ligament lymphadenopathy and retroperitoneal lymphadenopathy, concerning for metastatic disease. 3. Left portal vein thrombosis with hypoperfusion throughout the left lobe of the liver. 4. The tail of the pancreas appears slightly enlarged, and enhances to a lesser extent than the remainder of the head and body of the pancreas. No discrete mass is identified. Attention to this region at time of follow-up MRI is recommended to exclude the possibility of a subtle infiltrative neoplasm. 5. Innumerable small pulmonary nodules, and larger right lower lobe pulmonary mass measuring 3.0 x 3.6 x 3.0 cm. Findings are favored to reflect metastatic disease to the lungs, however, the possibility of a primary right lower lobe bronchogenic neoplasm is not excluded. As part of the workup for the patient's underlying primary malignancy and to define the full extent of metastatic disease, these findings could be further evaluated with PET-CT and/or biopsy if clinically appropriate. 6. Additional incidental findings, as above.   Electronically Signed  By: Vinnie Langton  M.D.  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 male with Pmhx as above who presents with abnormal CT abdomen pelvis from yesterday, with worsening LFTs and worsening elevation in T bili.  CT shows abnormal liver findings, with intrahepatic biliary ductal dilatation, concerning for potential infiltrative neoplasm such as cholangiocarcinoma.  He also has innumerable small pulmonary nodules which likely reflect metastatic disease.  Patient states he was told to present to the ED for admission, please been feeling unwell for several months and is also been having frequent temporal headaches, and pain between shoulder blades.  On physical exam he is jaundiced with abdominal ascites but in no acute distress.  His no focal neuro findings.  Abdomen is soft and nontender, he has no asterixis.  CT head is normal.  LFTs and T bili continue to worsen, when comparing lab work from [facility].  Lipase is also elevated.  Spoke with Dr[facility] Alcario Drought of [facility].  He will also speak to GI and  surgery, for plan for further management.     Ernestina Patches, MD 02/13/15 [phone]

## 2015-02-13 NOTE — ED Notes (Signed)
Patient transported to X-ray 

## 2015-02-13 NOTE — Progress Notes (Signed)
ANTICOAGULATION CONSULT NOTE - Initial Consult  Pharmacy Consult for Heparin Indication: portal vein thrombosis  No Known Allergies  Patient Measurements: Height: 6\' 1"  (185.4 cm) Weight: 286 lb (129.729 kg) IBW/kg (Calculated) : 79.9 Heparin Dosing Weight: 109 kg  Vital Signs: Temp: 97.8 F (36.6 C) (04/08 1559) Temp Source: Oral (04/08 1559) BP: 140/79 mmHg (04/08 1935) Pulse Rate: 88 (04/08 1935)  Labs:  Recent Labs  02/13/15 1801 02/13/15 1804  HGB 12.1*  --   HCT 37.0*  --   PLT 351  --   LABPROT  --  14.0  INR  --  1.07  CREATININE 0.58  --     Estimated Creatinine Clearance: 126.5 mL/min (by C-G formula based on Cr of 0.58).   Medical History: Past Medical History  Diagnosis Date  . Hypertension     Medications:  Scheduled:   Infusions:   PRN: morphine injection  Assessment: 69 yo male with cancer of the liver and intrahepatic bile ducts presenting to ER with 1 week history of generalized weakness, anorexia, worsening jaundice and abdominal pain. Pt has a bile duct mass at the top of his bile duct and may need a biliary stent placement. Heparin to be started for portal vein thrombosis.  Goal of Therapy:  Heparin level 0.3-0.7 units/ml Monitor platelets by anticoagulation protocol: Yes   Plan:  Will give heparin bolus 5000 units IV x1 then heparin drip at 1700 units/hr. Will check 6 hr HL and CBC then daily HL and CBC  Garnet Sierras 02/13/2015,9:20 PM

## 2015-02-13 NOTE — Telephone Encounter (Signed)
Referral received for cancer unknown primary. CT shows multiple pulmonary nodules, pancreatic head bulbous, intrhepatic biliary ductal dilitation and GB wall thickened. Total bili has gone from 0.9 to 5.0 in 1 month. He is in pain and has jaundice. Discussed case with Dr. Burr Medico: He needs to be admitted to hospital to get his bili down-may need drain/stent. Informed staff member at [facility] to have him be admitted by hospitalist at [facility] and while there, they can make a oncology referral. He needs more of a work up. Nurse will speak with Ovid Curd, [place] about advice. Will hold on to referral till he is ready to be discharged.

## 2015-02-13 NOTE — ED Notes (Signed)
Patient complains of weakness, anorexia and jaundice x1 week.  Patient also has abdominal distention and generalized icterus, excluding eyes.  Lung sounds are clear, +3 radial and pedal pulses palpated.  Bowel sounds diminished.

## 2015-02-13 NOTE — ED Notes (Signed)
Pt's wife states that pt been sick for several months. Pt has high bilirubin and had ct scan yesterday and was called last night and was told think its cancer and was told to come to ED.  Pt states that he has headaches.

## 2015-02-14 ENCOUNTER — Inpatient Hospital Stay (HOSPITAL_COMMUNITY): Payer: Medicare Other

## 2015-02-14 DIAGNOSIS — I10 Essential (primary) hypertension: Secondary | ICD-10-CM

## 2015-02-14 DIAGNOSIS — E876 Hypokalemia: Secondary | ICD-10-CM

## 2015-02-14 LAB — CBC
HEMATOCRIT: 34.9 % — AB (ref 39.0–52.0)
HEMOGLOBIN: 11.6 g/dL — AB (ref 13.0–17.0)
MCH: 30.9 pg (ref 26.0–34.0)
MCHC: 33.2 g/dL (ref 30.0–36.0)
MCV: 92.8 fL (ref 78.0–100.0)
PLATELETS: 324 10*3/uL (ref 150–400)
RBC: 3.76 MIL/uL — AB (ref 4.22–5.81)
RDW: 16.7 % — AB (ref 11.5–15.5)
WBC: 9.3 10*3/uL (ref 4.0–10.5)

## 2015-02-14 LAB — COMPREHENSIVE METABOLIC PANEL
ALT: 223 U/L — ABNORMAL HIGH (ref 0–53)
ANION GAP: 9 (ref 5–15)
AST: 169 U/L — AB (ref 0–37)
Albumin: 2.7 g/dL — ABNORMAL LOW (ref 3.5–5.2)
Alkaline Phosphatase: 725 U/L — ABNORMAL HIGH (ref 39–117)
BILIRUBIN TOTAL: 12.4 mg/dL — AB (ref 0.3–1.2)
BUN: 10 mg/dL (ref 6–23)
CO2: 26 mmol/L (ref 19–32)
Calcium: 8.4 mg/dL (ref 8.4–10.5)
Chloride: 103 mmol/L (ref 96–112)
Creatinine, Ser: 0.49 mg/dL — ABNORMAL LOW (ref 0.50–1.35)
GFR calc non Af Amer: >90 mL/min (ref >90)
Glucose, Bld: 130 mg/dL — ABNORMAL HIGH (ref 70–99)
POTASSIUM: 3.2 mmol/L — AB (ref 3.5–5.1)
Sodium: 138 mmol/L (ref 135–145)
Total Protein: 5.9 g/dL — ABNORMAL LOW (ref 6.0–8.3)

## 2015-02-14 LAB — HEPARIN LEVEL (UNFRACTIONATED)
HEPARIN UNFRACTIONATED: 0.26 [IU]/mL — AB (ref 0.30–0.70)
HEPARIN UNFRACTIONATED: 0.34 [IU]/mL (ref 0.30–0.70)

## 2015-02-14 MED ORDER — DICLOFENAC SODIUM 1 % TD GEL
2.0000 g | Freq: Four times a day (QID) | TRANSDERMAL | Status: DC
  Administered 2015-02-14 – 2015-02-19 (×19): 2 g via TOPICAL
  Filled 2015-02-14: qty 100

## 2015-02-14 MED ORDER — ACETAMINOPHEN ER 650 MG PO TBCR
1300.0000 mg | EXTENDED_RELEASE_TABLET | Freq: Every day | ORAL | Status: DC
Start: 2015-02-14 — End: 2015-02-14

## 2015-02-14 MED ORDER — POTASSIUM CHLORIDE 10 MEQ/100ML IV SOLN
10.0000 meq | INTRAVENOUS | Status: AC
  Administered 2015-02-14 (×4): 10 meq via INTRAVENOUS
  Filled 2015-02-14 (×4): qty 100

## 2015-02-14 MED ORDER — AMLODIPINE BESYLATE 2.5 MG PO TABS
2.5000 mg | ORAL_TABLET | Freq: Every day | ORAL | Status: DC
  Administered 2015-02-14 – 2015-02-16 (×3): 2.5 mg via ORAL
  Filled 2015-02-14 (×4): qty 1

## 2015-02-14 MED ORDER — HEPARIN (PORCINE) IN NACL 100-0.45 UNIT/ML-% IJ SOLN
1900.0000 [IU]/h | INTRAMUSCULAR | Status: DC
  Administered 2015-02-14 – 2015-02-15 (×2): 1900 [IU]/h via INTRAVENOUS
  Filled 2015-02-14 (×3): qty 250

## 2015-02-14 MED ORDER — ADULT MULTIVITAMIN W/MINERALS CH
1.0000 | ORAL_TABLET | Freq: Every day | ORAL | Status: DC
  Administered 2015-02-14 – 2015-02-19 (×5): 1 via ORAL
  Filled 2015-02-14 (×6): qty 1

## 2015-02-14 MED ORDER — GADOBENATE DIMEGLUMINE 529 MG/ML IV SOLN
20.0000 mL | Freq: Once | INTRAVENOUS | Status: AC | PRN
  Administered 2015-02-14: 20 mL via INTRAVENOUS

## 2015-02-14 MED ORDER — ACETAMINOPHEN 325 MG PO TABS: 650.0000 mg | ORAL_TABLET | ORAL | Status: DC | PRN

## 2015-02-14 MED ORDER — ACETAMINOPHEN 500 MG PO TABS
1000.0000 mg | ORAL_TABLET | Freq: Every day | ORAL | Status: DC
  Administered 2015-02-14 – 2015-02-18 (×4): 1000 mg via ORAL
  Filled 2015-02-14 (×7): qty 2

## 2015-02-14 MED ORDER — SODIUM CHLORIDE 0.9 % IV SOLN
INTRAVENOUS | Status: DC
  Administered 2015-02-14 – 2015-02-19 (×5): via INTRAVENOUS

## 2015-02-14 NOTE — Consult Note (Signed)
Referring Provider: Dr. Alcario Drought Primary Care Physician:  Fae Pippin Primary Gastroenterologist:  Althia Forts  Reason for Consultation:  Jaundice  HPI: Eddie Roberts is a 69 y.o. male who was admitted with 6 weeks of weakness and fatigue and 1.5 weeks of jaundice and dark-colored urine. Denies any associated abdominal pain, nausea or vomiting but has had a depressed appetite, constipation, and dry heaves. Patient found to have a total bilirubin of 13, ALP 777, AST 186, ALT 251. 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 Common bile duct not dilated on CT but poorly visualized per CT report. No pancreatic mass noted. Multiple small lung nodules seen and a 3 cm right lung mass also seen. Wife at bedside.   Past Medical History  Diagnosis Date  . Hypertension   Obesity  History reviewed. No pertinent past surgical history.  Prior to Admission medications   Medication Sig Start Date End Date Taking? Authorizing Provider  acetaminophen (TYLENOL) 650 MG CR tablet Take 1,300 mg by mouth at bedtime.   Yes Historical Provider, MD  amLODipine (NORVASC) 2.5 MG tablet Take 2.5 mg by mouth at bedtime.   Yes Historical Provider, MD  Ascorbic Acid (VITAMIN C) 100 MG tablet Take 100 mg by mouth daily.   Yes Historical Provider, MD  Doxylamine Succinate, Sleep, (SLEEP AID PO) Take 2-3 tablets by mouth at bedtime.   Yes Historical Provider, MD  Multiple Vitamin (MULTIVITAMIN) tablet Take 1 tablet by mouth daily.   Yes Historical Provider, MD  doxycycline (VIBRA-TABS) 100 MG tablet Take 1 tablet (100 mg total) by mouth 2 (two) times daily. Patient not taking: Reported on 02/13/2015 03/22/14   Orma Flaming, MD    Scheduled Meds: . acetaminophen  1,000 mg Oral QHS  . amLODipine  2.5 mg Oral QHS  . diclofenac sodium  2 g Topical QID  . multivitamin with minerals  1  tablet Oral Daily   Continuous Infusions: . sodium chloride 125 mL/hr at 02/14/15 0945  . heparin 1,900 Units/hr (02/14/15 1111)   PRN Meds:.acetaminophen, morphine injection  Allergies as of 02/13/2015  . (No Known Allergies)    No family history on file.  History   Social History  . Marital Status: Single    Spouse Name: N/A  . Number of Children: N/A  . Years of Education: N/A   Occupational History  . Not on file.   Social History Main Topics  . Smoking status: Never Smoker   . Smokeless tobacco: Not on file  . Alcohol Use: No  . Drug Use: No  . Sexual Activity: Not on file   Other Topics Concern  . Not on file   Social History Narrative    Review of Systems: All negative from GI standpoint except as stated above in HPI; Reviewed and agree with initial ROS   Physical Exam: Vital signs: Filed Vitals:   02/14/15 1311  BP: 142/71  Pulse: 86  Temp: 97.7 F (36.5 C)  Resp: 16   Last BM Date: 02/12/15 General:  Obese, no acute distress Head: normocephalic Eyes: +scleral icterus, pupils reactive Nose: normal Mouth: normal appearance Neck: supple, nontender Lungs:  Clear throughout to auscultation.   No wheezes, crackles, or rhonchi. No acute distress. Heart:  Regular rate and rhythm; no murmurs, clicks, rubs,  or gallops. Abdomen: soft, nontender, nondistended, +BS  Rectal:  Deferred Ext: no edema Neuro: Lethargic, oriented, no  asterixis Psych: normal mood, affect  GI:  Lab Results:  Recent Labs  02/13/15 1801 02/14/15 0408  WBC 9.6 9.3  HGB 12.1* 11.6*  HCT 37.0* 34.9*  PLT 351 324   BMET  Recent Labs  02/13/15 1801 02/14/15 0408  NA 138 138  K 3.1* 3.2*  CL 102 103  CO2 24 26  GLUCOSE 123* 130*  BUN 11 10  CREATININE 0.58 0.49*  CALCIUM 8.9 8.4   LFT  Recent Labs  02/14/15 0408  PROT 5.9*  ALBUMIN 2.7*  AST 169*  ALT 223*  ALKPHOS 725*  BILITOT 12.4*   PT/INR  Recent Labs  02/13/15 1804  LABPROT 14.0  INR  1.07     Studies/Results: see imaging section  Impression/Plan: 69 yo with obstructive jaundice 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 Mass at the hepatic hilum without distal CBD dilation makes ERCP less likely to be helpful or feasible. Likely will need a PTC by radiology to relieve the obstruction of this proximal ductal mass and also to obtain bile duct sampling. Agree with radiology that a MRI/MRCP is needed to further delineate the mass and associated features and also to better visualize the distal biliary tree and pancreas. IR consulted by hospitalist and after discussion with them. NPO and then clears after MRCP but NPO p MN pending IR recs. Supportive care. Will follow.    LOS: 1 day   [place] C.  02/14/2015, 1:23 PM

## 2015-02-14 NOTE — Progress Notes (Signed)
ANTICOAGULATION CONSULT NOTE - Follow Up Consult  Pharmacy Consult for Heparin Indication: portal vein thrombosis  No Known Allergies  Patient Measurements: Height: 6\' 1"  (185.4 cm) Weight: 286 lb (129.729 kg) IBW/kg (Calculated) : 79.9 Heparin Dosing Weight: 109 kg  Vital Signs: Temp: 98 F (36.7 C) (04/09 0458) Temp Source: Oral (04/09 0458) BP: 131/69 mmHg (04/09 0458) Pulse Rate: 84 (04/09 0458)  Labs:  Recent Labs  02/13/15 1801 02/13/15 1804 02/13/15 2330 02/14/15 0408 02/14/15 1105  HGB 12.1*  --   --  11.6*  --   HCT 37.0*  --   --  34.9*  --   PLT 351  --   --  324  --   APTT  --   --  33  --   --   LABPROT  --  14.0  --   --   --   INR  --  1.07  --   --   --   HEPARINUNFRC  --   --   --  0.26* 0.34  CREATININE 0.58  --   --  0.49*  --     Estimated Creatinine Clearance: 126.5 mL/min (by C-G formula based on Cr of 0.49).   Medical History: Past Medical History  Diagnosis Date  . Hypertension     Medications:  Scheduled:  . acetaminophen  1,000 mg Oral QHS  . amLODipine  2.5 mg Oral QHS  . diclofenac sodium  2 g Topical QID  . multivitamin with minerals  1 tablet Oral Daily  . potassium chloride  10 mEq Intravenous Q1 Hr x 4   Infusions:  . sodium chloride 125 mL/hr at 02/14/15 0945  . heparin 1,900 Units/hr (02/14/15 1111)   PRN: acetaminophen, morphine injection  Assessment: 69 yo male with cancer of the liver and intrahepatic bile ducts presenting to ER with 1 week history of generalized weakness, anorexia, worsening jaundice and abdominal pain. Pt has a bile duct mass at the top of his bile duct and may need a biliary stent placement. Heparin to be started for portal vein thrombosis.  Today, 02/14/2015 04:00 Heparin Level = 0.26 (subtherapeutic) with heparin infusing @ 1700 units/hr. Rate increased to 1900 units/hr. 11:00 Heparin level = 0.34 (therapeutic) on 1900 units/hr. CBC stable, no bleeding reported per notes.  Goal of Therapy:   Heparin level 0.3-0.7 units/ml Monitor platelets by anticoagulation protocol: Yes   Plan:   Continue heparin at current rate  Daily heparin level and CBC  F/u plans for holding heparin if invasive procedure planned  Peggyann Juba, PharmD, BCPS Pager: [phone] 02/14/2015,11:34 AM

## 2015-02-14 NOTE — Plan of Care (Signed)
Problem: Consults Goal: General Medical Patient Education See Patient Education Module for specific education. Outcome: Progressing Instructed on heparin drip and lab work. Pt voiced understanding.

## 2015-02-14 NOTE — Progress Notes (Signed)
ANTICOAGULATION CONSULT NOTE - Follow Up Consult  Pharmacy Consult for Heparin Indication: portal vein thrombosis  No Known Allergies  Patient Measurements: Height: 6\' 1"  (185.4 cm) Weight: 286 lb (129.729 kg) IBW/kg (Calculated) : 79.9 Heparin Dosing Weight: 109 kg  Vital Signs: Temp: 98 F (36.7 C) (04/09 0458) Temp Source: Oral (04/09 0458) BP: 131/69 mmHg (04/09 0458) Pulse Rate: 84 (04/09 0458)  Labs:  Recent Labs  02/13/15 1801 02/13/15 1804 02/13/15 2330 02/14/15 0408  HGB 12.1*  --   --  11.6*  HCT 37.0*  --   --  34.9*  PLT 351  --   --  324  APTT  --   --  33  --   LABPROT  --  14.0  --   --   INR  --  1.07  --   --   HEPARINUNFRC  --   --   --  0.26*  CREATININE 0.58  --   --   --     Estimated Creatinine Clearance: 126.5 mL/min (by C-G formula based on Cr of 0.58).   Medical History: Past Medical History  Diagnosis Date  . Hypertension     Medications:  Scheduled:   Infusions:  . 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 Pt has a bile duct mass at the top of his bile duct and may need a biliary stent placement. Heparin to be started for portal vein thrombosis.  4/9:  Heparin Level = 0.26 (subtherapeutic) with heparin infusing @ 1700 units/hr No complications of therapy noted.  Goal of Therapy:  Heparin level 0.3-0.7 units/ml Monitor platelets by anticoagulation protocol: Yes   Plan:  Increase heparin gtt to 1900 units/hr Will check 6 hr HL then daily HL and CBC  Teva Bronkema, Toribio Harbour, PharmD 02/14/2015,5:02 AM

## 2015-02-14 NOTE — Progress Notes (Addendum)
[facility] Progress Note   Eddie Roberts CHE:[id number] DOB: 02/23/1946 DOA: 02/13/2015 PCP: Fae Pippin  Brief narrative: Eddie Roberts is a 69 y.o. male with a past medical history of recently diagnosed hypertension who presents for an abnormal CT. He has had generalized weakness for one month and anorexia and jaundice. He has lost about 10-15 pounds. He was called by the [facility] ER about the CT scan that was done on the day prior to presenting to our ER. He was told that cording to the CT, he was suspected to have cancer.  Subjective: Gives me a history of a one-month of "feeling lousy", has lost his appetite, feels weak and has jaundice and dark urine. Bowel movements have been normal. No abdominal pain nausea or vomiting.  Assessment/Plan: Principal Problem:   Metastatic cancer- elevated LFTs with jaundice - Had a CT of the abdomen and pelvis on 4/7 at [place] reveals intrahepatic bile duct dilated patient and are concerning for an infiltrative neoplasm. There is hepatojugular and retroperitoneal lymphadenopathy concerning for metastatic disease-tail of the pancreas also appears abnormal - innumerable small pulmonary nodules- larger right lower lobe pulmonary mass measuring about 3 cm - According to above reports the primary source of the cancer is uncertain - CT head without contrast on 4/8 was negative for metastasis - Have consulted GI-the patient will be seen by Dr. Michail Sermon later today-I have spoken with him and he recommends clear liquids for now and a consult for interventional radiology as he may not be able to reach the mass via ERCP  Left portal vein thrombus - Also noted on above imaging-currently on a heparin infusion which will allow for procedures during the hospital stay  Hypokalemia - Placing potassium-rechecked tomorrow  Hypertension -Apparently this is mild and he was started on a low-dose of Norvasc recently at 2.5 mg daily  at bedtime which I have continued  Left upper back pain This started about a month ago and he has been taking Tylenol for it-will order Voltaren gel and follow   Appt with PCP: Code Status: Full code Family Communication:  Disposition Plan: Awaiting GI and evaluation DVT prophylaxis: Heparin infusion Consultants: GI-interventional radiology Procedures:  Antibiotics: Anti-infectives    None      Objective: Filed Weights   02/13/15 1844 02/13/15 2313  Weight: 129.729 kg (286 lb) 129.729 kg (286 lb)    Intake/Output Summary (Last 24 hours) at 02/14/15 1035 Last data filed at 02/13/15 2315  Gross per 24 hour  Intake    250 ml  Output      0 ml  Net    250 ml     Vitals Filed Vitals:   02/13/15 1844 02/13/15 1935 02/13/15 2313 02/14/15 0458  BP:  140/79 134/64 131/69  Pulse:  88 95 84  Temp:   97.6 F (36.4 C) 98 F (36.7 C)  TempSrc:   Oral Oral  Resp:  14 16 16   Height: 6\' 1"  (1.854 m)  6\' 1"  (1.854 m)   Weight: 129.729 kg (286 lb)  129.729 kg (286 lb)   SpO2:  95% 96% 96%    Exam:  General:  Pt is alert, not in acute distress- significantly jaundiced  HEENT: No icterus, No thrush  Cardiovascular: regular rate and rhythm, S1/S2 No murmur  Respiratory: clear to auscultation bilaterally   Abdomen: Soft, +Bowel sounds, non tender, non distended, no guarding  MSK: No LE edema, cyanosis or clubbing  Data Reviewed: Basic Metabolic Panel:  Recent Labs Lab 02/13/15 1801 02/14/15 0408  NA 138 138  K 3.1* 3.2*  CL 102 103  CO2 24 26  GLUCOSE 123* 130*  BUN 11 10  CREATININE 0.58 0.49*  CALCIUM 8.9 8.4   Liver Function Tests:  Recent Labs Lab 02/13/15 1801 02/14/15 0408  AST 186* 169*  ALT 251* 223*  ALKPHOS 777* 725*  BILITOT 13.0* 12.4*  PROT 6.7 5.9*  ALBUMIN 3.1* 2.7*    Recent Labs Lab 02/13/15 1801  LIPASE 110*   No results for input(s): AMMONIA in the last 168 hours. CBC:  Recent Labs Lab 02/13/15 1801 02/14/15 0408   WBC 9.6 9.3  NEUTROABS 7.8*  --   HGB 12.1* 11.6*  HCT 37.0* 34.9*  MCV 93.0 92.8  PLT 351 324   Cardiac Enzymes: No results for input(s): CKTOTAL, CKMB, CKMBINDEX, TROPONINI in the last 168 hours. BNP (last 3 results) No results for input(s): BNP in the last 8760 hours.  ProBNP (last 3 results) No results for input(s): PROBNP in the last 8760 hours.  CBG: No results for input(s): GLUCAP in the last 168 hours.  No results found for this or any previous visit (from the past 240 hour(s)).   Studies:  Recent x-ray studies have been reviewed in detail by the Attending Physician  Scheduled Meds:  Scheduled Meds: . acetaminophen  1,000 mg Oral QHS  . amLODipine  2.5 mg Oral QHS  . diclofenac sodium  2 g Topical QID  . multivitamin with minerals  1 tablet Oral Daily  . potassium chloride  10 mEq Intravenous Q1 Hr x 4   Continuous Infusions: . sodium chloride    . heparin 1,900 Units/hr (02/14/15 0603)    Time spent on care of this patient: 40 minutes Page, MD 02/14/2015, 10:35 AM  LOS: 1 day   [facility] Office  [phone] Pager - Text Page per [email or web address]  If 7PM-7AM, please contact night-coverage [email or web address]

## 2015-02-15 LAB — HEPATIC FUNCTION PANEL
ALBUMIN: 2.6 g/dL — AB (ref 3.5–5.2)
ALT: 243 U/L — ABNORMAL HIGH (ref 0–53)
AST: 173 U/L — ABNORMAL HIGH (ref 0–37)
Alkaline Phosphatase: 727 U/L — ABNORMAL HIGH (ref 39–117)
BILIRUBIN DIRECT: 9.4 mg/dL — AB (ref 0.0–0.5)
BILIRUBIN INDIRECT: 5.5 mg/dL — AB (ref 0.3–0.9)
BILIRUBIN TOTAL: 14.9 mg/dL — AB (ref 0.3–1.2)
Total Protein: 6 g/dL (ref 6.0–8.3)

## 2015-02-15 LAB — HEPARIN LEVEL (UNFRACTIONATED)
HEPARIN UNFRACTIONATED: 0.29 [IU]/mL — AB (ref 0.30–0.70)
HEPARIN UNFRACTIONATED: 0.2 [IU]/mL — AB (ref 0.30–0.70)
Heparin Unfractionated: 0.24 IU/mL — ABNORMAL LOW (ref 0.30–0.70)

## 2015-02-15 LAB — CBC
HEMATOCRIT: 32.9 % — AB (ref 39.0–52.0)
HEMOGLOBIN: 10.9 g/dL — AB (ref 13.0–17.0)
MCH: 30.6 pg (ref 26.0–34.0)
MCHC: 33.1 g/dL (ref 30.0–36.0)
MCV: 92.4 fL (ref 78.0–100.0)
PLATELETS: 316 10*3/uL (ref 150–400)
RBC: 3.56 MIL/uL — ABNORMAL LOW (ref 4.22–5.81)
RDW: 17 % — ABNORMAL HIGH (ref 11.5–15.5)
WBC: 8.4 10*3/uL (ref 4.0–10.5)

## 2015-02-15 MED ORDER — HEPARIN (PORCINE) IN NACL 100-0.45 UNIT/ML-% IJ SOLN
2050.0000 [IU]/h | INTRAMUSCULAR | Status: DC
  Filled 2015-02-15 (×2): qty 250

## 2015-02-15 MED ORDER — DEXTROSE 5 % IV SOLN
3.3750 g | Freq: Once | INTRAVENOUS | Status: AC
  Administered 2015-02-16: 3.375 g via INTRAVENOUS
  Filled 2015-02-15: qty 3.38

## 2015-02-15 MED ORDER — HEPARIN (PORCINE) IN NACL 100-0.45 UNIT/ML-% IJ SOLN
2550.0000 [IU]/h | INTRAMUSCULAR | Status: DC
  Administered 2015-02-15: 2300 [IU]/h via INTRAVENOUS
  Administered 2015-02-16: 2550 [IU]/h via INTRAVENOUS
  Filled 2015-02-15 (×3): qty 250

## 2015-02-15 NOTE — Progress Notes (Addendum)
ANTICOAGULATION CONSULT NOTE - Follow Up Consult  Pharmacy Consult for Heparin Indication: portal vein thrombosis  No Known Allergies  Patient Measurements: Height: 6\' 1"  (185.4 cm) Weight: 286 lb (129.729 kg) IBW/kg (Calculated) : 79.9 Heparin Dosing Weight: 109 kg  Vital Signs: Temp: 97.5 F (36.4 C) (04/10 0516) Temp Source: Oral (04/10 0516) BP: 155/75 mmHg (04/10 0516) Pulse Rate: 87 (04/10 0516)  Labs:  Recent Labs  02/13/15 1801 02/13/15 1804 02/13/15 2330  02/14/15 0408 02/14/15 1105 02/15/15 0426 02/15/15 1210  HGB 12.1*  --   --   --  11.6*  --  10.9*  --   HCT 37.0*  --   --   --  34.9*  --  32.9*  --   PLT 351  --   --   --  324  --  316  --   APTT  --   --  33  --   --   --   --   --   LABPROT  --  14.0  --   --   --   --   --   --   INR  --  1.07  --   --   --   --   --   --   HEPARINUNFRC  --   --   --   < > 0.26* 0.34 0.29* 0.20*  CREATININE 0.58  --   --   --  0.49*  --   --   --   < > = values in this interval not displayed.  Estimated Creatinine Clearance: 126.5 mL/min (by C-G formula based on Cr of 0.49).   Medical History: Past Medical History  Diagnosis Date  . Hypertension     Medications:  Scheduled:  . acetaminophen  1,000 mg Oral QHS  . amLODipine  2.5 mg Oral QHS  . diclofenac sodium  2 g Topical QID  . multivitamin with minerals  1 tablet Oral Daily   Infusions:  . sodium chloride 125 mL/hr at 02/14/15 0945  . heparin 2,050 Units/hr (02/15/15 7564)   PRN: acetaminophen, morphine injection  Assessment: 69 yo male with cancer of the liver and intrahepatic bile ducts presenting to ER with 1 week history of generalized weakness, anorexia, worsening jaundice and abdominal pain. Pt has a bile duct mass at the top of his bile duct and may need a biliary stent placement. Heparin to be started for portal vein thrombosis.  Today, 02/15/2015   05:00 Heparin Level = 0.29 (subtherapeutic) with heparin infusing @ 1900 units/hr. Rate  increased to 2050 units/hr, plan to recheck HL in 6hrs  12:10 heparin level = 0.20 (subtherapeutic) with heparin infusing @ 2050 units/hr. Patient reports that his IVs were "beeping" a lot this morning, thinks he heard his nurse mention some problems with air bubbles in the IV (although not sure if it was the heparin or normal saline)  H/H decreased slightly, Plts stable, no bleeding reported   Goal of Therapy:  Heparin level 0.3-0.7 units/ml Monitor platelets by anticoagulation protocol: Yes   Plan:   Increase heparin to 2300 units/hr  Check heparin level 6 hr after rate increase  Daily heparin level and CBC  F/u plans for holding heparin per IR for procedure tomorrow  Peggyann Juba, PharmD, BCPS Pager: [phone]  02/15/2015,1:09 PM

## 2015-02-15 NOTE — Progress Notes (Signed)
Patient ID: Eddie Roberts, male   DOB: 07/07/1946, 69 y.o.   MRN: [id number] Eddie Roberts [facility] Progress Note  Eddie Roberts 69 y.o. [birth year]   Subjective: Complaining of back pain. Denies abdominal pain, nausea, or vomiting.  Objective: Vital signs in last 24 hours: Filed Vitals:   02/15/15 0516  BP: 155/75  Pulse: 87  Temp: 97.5 F (36.4 C)  Resp: 20    Physical Exam: Gen: lethargic, no acute distress, obese Eyes: +scleral icterus CV: RRR Chest: clear anteriorly Abd: soft, nontender, nondistended, +BS Skin: jaundice Neuro: oriented  Lab Results:  Recent Labs  02/13/15 1801 02/14/15 0408  NA 138 138  K 3.1* 3.2*  CL 102 103  CO2 24 26  GLUCOSE 123* 130*  BUN 11 10  CREATININE 0.58 0.49*  CALCIUM 8.9 8.4    Recent Labs  02/13/15 1801 02/14/15 0408  AST 186* 169*  ALT 251* 223*  ALKPHOS 777* 725*  BILITOT 13.0* 12.4*  PROT 6.7 5.9*  ALBUMIN 3.1* 2.7*    Recent Labs  02/13/15 1801 02/14/15 0408 02/15/15 0426  WBC 9.6 9.3 8.4  NEUTROABS 7.8*  --   --   HGB 12.1* 11.6* 10.9*  HCT 37.0* 34.9* 32.9*  MCV 93.0 92.8 92.4  PLT 351 324 316    Recent Labs  02/13/15 1804  LABPROT 14.0  INR 1.07      Assessment/Plan: 69 yo with obstructive jaundice from a tumor in the liver blocking the right and left hepatic ducts with mets to the lung on MRCP and CT. CBD normal caliber on MRCP and ERCP not likely to be successful at relieving the obstruction. Needs a PTC with bile duct brushing by interventional radiology and that is planned for tomorrow but if that is unsuccessful then can consider the role of cholangioscopic evaluation with Spy Glass technology. On Heparin drip and will defer to radiology how soon to hold that prior to the procedure. Clear liquids. NPO p MN. Dr. Watt Roberts available to see this week.     Eddie C. 02/15/2015, 11:08 AM

## 2015-02-15 NOTE — Progress Notes (Signed)
[facility] Progress Note   Eddie Roberts NTI:[id number] DOB: Sep 13, 1946 DOA: 02/13/2015 PCP: Fae Pippin  Brief narrative: Eddie Roberts is a 69 y.o. male with a past medical history of recently diagnosed hypertension who presents for an abnormal CT. He has had generalized weakness for one month and anorexia and jaundice. He has lost about 10-15 pounds. He was called by the [facility] ER about the CT scan that was done on the day prior to presenting to our ER. He was told that cording to the CT, he was suspected to have cancer.  Subjective: Continues to feel quite weak and lacks an appetite. Right upper back pain has improved with Voltaren gel.  Assessment/Plan: Principal Problem:   Metastatic cancer- elevated LFTs with jaundice - Had a CT of the abdomen and pelvis on 4/7 at [place] reveals intrahepatic bile duct dilated patient and are concerning for an infiltrative neoplasm. There is hepatojugular and retroperitoneal lymphadenopathy concerning for metastatic disease-tail of the pancreas also appears abnormal - innumerable small pulmonary nodules- larger right lower lobe pulmonary mass measuring about 3 cm - According to above reports the primary source of the cancer is uncertain - CT head without contrast on 4/8 was negative for metastasis - Have consulted GI-DrSchooler has been in communication with interventional radiology-an MRCP performed last night reveals infiltrative tumor along the left hepatic lobe x-ray and extending into the right and left hepatic ducts and possibly also the portal vein. There is also nodularity in the peritoneum worrisome for transperitoneal spread and enlarged upper abdominal lymph nodes. Pulmonary metastasis and right lower lobe lung mass is also noted -Further management per GI and IR  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 - Also noted on above imaging-currently on a heparin infusion which will allow for procedures during  the hospital stay- will need to further discuss with GI to see if this is tumor versus thrombus and whether heparin can be discontinued  Hypokalemia - Replaced-repeat [place] tomorrow  Hypertension -Apparently this is mild and he was started on a low-dose of Norvasc recently at 2.5 mg daily at bedtime which I have continued  Left upper back pain This started about a month ago and he has been taking Tylenol for it- have started Voltaren gel 4 times a day which is helping   Appt with PCP: Code Status: Full code Family Communication:  Disposition Plan: To be determined DVT prophylaxis: Heparin infusion Consultants: GI-interventional radiology Procedures:  Antibiotics: Anti-infectives    None      Objective: Filed Weights   02/13/15 1844 02/13/15 2313  Weight: 129.729 kg (286 lb) 129.729 kg (286 lb)    Intake/Output Summary (Last 24 hours) at 02/15/15 1054 Last data filed at 02/15/15 0808  Gross per 24 hour  Intake 3514.92 ml  Output      0 ml  Net 3514.92 ml     Vitals Filed Vitals:   02/14/15 0458 02/14/15 1311 02/14/15 2047 02/15/15 0516  BP: 131/69 142/71 146/83 155/75  Pulse: 84 86 87 87  Temp: 98 F (36.7 C) 97.7 F (36.5 C) 98.1 F (36.7 C) 97.5 F (36.4 C)  TempSrc: Oral Oral Oral Oral  Resp: 16 16 20 20   Height:      Weight:      SpO2: 96% 96% 98% 97%    Exam:  General:  Pt is alert, not in acute distress- significantly jaundiced  HEENT: No icterus, No thrush  Cardiovascular: regular rate and rhythm, S1/S2 No murmur  Respiratory: clear to auscultation bilaterally   Abdomen: Soft, +Bowel sounds, non tender, non distended, no guarding  MSK: No LE edema, cyanosis or clubbing  Data Reviewed: Basic Metabolic Panel:  Recent Labs Lab 02/13/15 1801 02/14/15 0408  NA 138 138  K 3.1* 3.2*  CL 102 103  CO2 24 26  GLUCOSE 123* 130*  BUN 11 10  CREATININE 0.58 0.49*  CALCIUM 8.9 8.4   Liver Function Tests:  Recent Labs Lab  02/13/15 1801 02/14/15 0408  AST 186* 169*  ALT 251* 223*  ALKPHOS 777* 725*  BILITOT 13.0* 12.4*  PROT 6.7 5.9*  ALBUMIN 3.1* 2.7*    Recent Labs Lab 02/13/15 1801  LIPASE 110*   No results for input(s): AMMONIA in the last 168 hours. CBC:  Recent Labs Lab 02/13/15 1801 02/14/15 0408 02/15/15 0426  WBC 9.6 9.3 8.4  NEUTROABS 7.8*  --   --   HGB 12.1* 11.6* 10.9*  HCT 37.0* 34.9* 32.9*  MCV 93.0 92.8 92.4  PLT 351 324 316   Cardiac Enzymes: No results for input(s): CKTOTAL, CKMB, CKMBINDEX, TROPONINI in the last 168 hours. BNP (last 3 results) No results for input(s): BNP in the last 8760 hours.  ProBNP (last 3 results) No results for input(s): PROBNP in the last 8760 hours.  CBG: No results for input(s): GLUCAP in the last 168 hours.  No results found for this or any previous visit (from the past 240 hour(s)).   Studies:  Recent x-ray studies have been reviewed in detail by the Attending Physician  Scheduled Meds:  Scheduled Meds: . acetaminophen  1,000 mg Oral QHS  . amLODipine  2.5 mg Oral QHS  . diclofenac sodium  2 g Topical QID  . multivitamin with minerals  1 tablet Oral Daily   Continuous Infusions: . sodium chloride 125 mL/hr at 02/14/15 0945  . heparin 2,050 Units/hr (02/15/15 1324)    Time spent on care of this patient: 35 minutes [place], MD 02/15/2015, 10:54 AM  LOS: 2 days   [facility] Office  [phone] Pager - Text Page per [email or web address]  If 7PM-7AM, please contact night-coverage [email or web address]

## 2015-02-15 NOTE — Progress Notes (Signed)
ANTICOAGULATION CONSULT NOTE - Follow Up Consult  Pharmacy Consult for Heparin Indication: portal vein thrombosis  No Known Allergies  Patient Measurements: Height: 6\' 1"  (185.4 cm) Weight: 286 lb (129.729 kg) IBW/kg (Calculated) : 79.9 Heparin Dosing Weight: 109 kg  Vital Signs: Temp: 97.5 F (36.4 C) (04/10 0516) Temp Source: Oral (04/10 0516) BP: 155/75 mmHg (04/10 0516) Pulse Rate: 87 (04/10 0516)  Labs:  Recent Labs  02/13/15 1801 02/13/15 1804 02/13/15 2330 02/14/15 0408 02/14/15 1105 02/15/15 0426  HGB 12.1*  --   --  11.6*  --  10.9*  HCT 37.0*  --   --  34.9*  --  32.9*  PLT 351  --   --  324  --  316  APTT  --   --  33  --   --   --   LABPROT  --  14.0  --   --   --   --   INR  --  1.07  --   --   --   --   HEPARINUNFRC  --   --   --  0.26* 0.34 0.29*  CREATININE 0.58  --   --  0.49*  --   --     Estimated Creatinine Clearance: 126.5 mL/min (by C-G formula based on Cr of 0.49).   Medical History: Past Medical History  Diagnosis Date  . Hypertension     Medications:  Scheduled:  . acetaminophen  1,000 mg Oral QHS  . amLODipine  2.5 mg Oral QHS  . diclofenac sodium  2 g Topical QID  . multivitamin with minerals  1 tablet Oral Daily   Infusions:  . sodium chloride 125 mL/hr at 02/14/15 0945  . 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 Pt has a bile duct mass at the top of his bile duct and may need a biliary stent placement. Heparin to be started for portal vein thrombosis.  Today, 02/15/2015 05:00 Heparin Level = 0.29 (subtherapeutic) with heparin infusing @ 1900 units/hr. CBC stable, no bleeding reported per notes.  Goal of Therapy:  Heparin level 0.3-0.7 units/ml Monitor platelets by anticoagulation protocol: Yes   Plan:   Increase heparin  to 2050 units/hr  Check heparin level 6 hr after rate increase  Daily heparin level and CBC  F/u plans for holding heparin if invasive procedure planned  Leone Haven, PharmD  02/15/2015,5:26 AM

## 2015-02-15 NOTE — Progress Notes (Signed)
ANTICOAGULATION CONSULT NOTE - Follow Up Consult  Pharmacy Consult for Heparin  Indication: Portal Vein Thrombosis  No Known Allergies  Patient Measurements: Height: 6\' 1"  (185.4 cm) Weight: 286 lb (129.729 kg) IBW/kg (Calculated) : 79.9  Vital Signs: Temp: 97.5 F (36.4 C) (04/10 1358) Temp Source: Oral (04/10 1358) BP: 154/81 mmHg (04/10 1358) Pulse Rate: 78 (04/10 1358)  Labs:  Recent Labs  02/13/15 1801 02/13/15 1804 02/13/15 2330 02/14/15 0408  02/15/15 0426 02/15/15 1210 02/15/15 2030  HGB 12.1*  --   --  11.6*  --  10.9*  --   --   HCT 37.0*  --   --  34.9*  --  32.9*  --   --   PLT 351  --   --  324  --  316  --   --   APTT  --   --  33  --   --   --   --   --   LABPROT  --  14.0  --   --   --   --   --   --   INR  --  1.07  --   --   --   --   --   --   HEPARINUNFRC  --   --   --  0.26*  < > 0.29* 0.20* 0.24*  CREATININE 0.58  --   --  0.49*  --   --   --   --   < > = values in this interval not displayed.  Estimated Creatinine Clearance: 126.5 mL/min (by C-G formula based on Cr of 0.49).  Assessment: Sub-therapeutic heparin level after multiple rate increases, no issues per RN.   Goal of Therapy:  Heparin level 0.3-0.7 units/ml Monitor platelets by anticoagulation protocol: Yes   Plan:  -Increase heparin to 2550 units/hr -0500 HL -Daily CBC/HL -Monitor for bleeding, trend Hgb  [place], [place] 02/15/2015,9:33 PM

## 2015-02-16 ENCOUNTER — Inpatient Hospital Stay (HOSPITAL_COMMUNITY): Payer: Medicare Other

## 2015-02-16 ENCOUNTER — Encounter (HOSPITAL_COMMUNITY): Payer: Self-pay | Admitting: Radiology

## 2015-02-16 DIAGNOSIS — R945 Abnormal results of liver function studies: Secondary | ICD-10-CM

## 2015-02-16 DIAGNOSIS — R17 Unspecified jaundice: Secondary | ICD-10-CM | POA: Diagnosis present

## 2015-02-16 DIAGNOSIS — R160 Hepatomegaly, not elsewhere classified: Secondary | ICD-10-CM | POA: Diagnosis present

## 2015-02-16 DIAGNOSIS — R7989 Other specified abnormal findings of blood chemistry: Secondary | ICD-10-CM

## 2015-02-16 DIAGNOSIS — K831 Obstruction of bile duct: Secondary | ICD-10-CM | POA: Diagnosis present

## 2015-02-16 LAB — BASIC METABOLIC PANEL
Anion gap: 8 (ref 5–15)
BUN: 7 mg/dL (ref 6–23)
CALCIUM: 8 mg/dL — AB (ref 8.4–10.5)
CO2: 23 mmol/L (ref 19–32)
CREATININE: 0.37 mg/dL — AB (ref 0.50–1.35)
Chloride: 105 mmol/L (ref 96–112)
GFR calc Af Amer: >90 mL/min (ref >90)
GFR calc non Af Amer: >90 mL/min (ref >90)
GLUCOSE: 122 mg/dL — AB (ref 70–99)
Potassium: 3.1 mmol/L — ABNORMAL LOW (ref 3.5–5.1)
SODIUM: 136 mmol/L (ref 135–145)

## 2015-02-16 LAB — CBC
HEMATOCRIT: 33 % — AB (ref 39.0–52.0)
Hemoglobin: 10.9 g/dL — ABNORMAL LOW (ref 13.0–17.0)
MCH: 30.6 pg (ref 26.0–34.0)
MCHC: 33 g/dL (ref 30.0–36.0)
MCV: 92.7 fL (ref 78.0–100.0)
PLATELETS: 323 10*3/uL (ref 150–400)
RBC: 3.56 MIL/uL — ABNORMAL LOW (ref 4.22–5.81)
RDW: 17 % — AB (ref 11.5–15.5)
WBC: 8.9 10*3/uL (ref 4.0–10.5)

## 2015-02-16 LAB — HEPATIC FUNCTION PANEL
ALK PHOS: 672 U/L — AB (ref 39–117)
ALT: 220 U/L — AB (ref 0–53)
AST: 155 U/L — ABNORMAL HIGH (ref 0–37)
Albumin: 2.4 g/dL — ABNORMAL LOW (ref 3.5–5.2)
BILIRUBIN INDIRECT: 5 mg/dL — AB (ref 0.3–0.9)
BILIRUBIN TOTAL: 14.1 mg/dL — AB (ref 0.3–1.2)
Bilirubin, Direct: 9.1 mg/dL — ABNORMAL HIGH (ref 0.0–0.5)
Total Protein: 5.9 g/dL — ABNORMAL LOW (ref 6.0–8.3)

## 2015-02-16 LAB — MAGNESIUM: Magnesium: 2 mg/dL (ref 1.5–2.5)

## 2015-02-16 LAB — HEPARIN LEVEL (UNFRACTIONATED): HEPARIN UNFRACTIONATED: 0.54 [IU]/mL (ref 0.30–0.70)

## 2015-02-16 MED ORDER — POTASSIUM CHLORIDE CRYS ER 20 MEQ PO TBCR
40.0000 meq | EXTENDED_RELEASE_TABLET | Freq: Once | ORAL | Status: AC
  Administered 2015-02-16: 40 meq via ORAL
  Filled 2015-02-16: qty 2

## 2015-02-16 MED ORDER — FENTANYL CITRATE 0.05 MG/ML IJ SOLN
INTRAMUSCULAR | Status: AC
  Filled 2015-02-16: qty 6

## 2015-02-16 MED ORDER — SODIUM CHLORIDE 0.9 % IV SOLN: INTRAVENOUS | Status: DC

## 2015-02-16 MED ORDER — FENTANYL CITRATE 0.05 MG/ML IJ SOLN
INTRAMUSCULAR | Status: AC | PRN
  Administered 2015-02-16 (×2): 25 ug via INTRAVENOUS
  Administered 2015-02-16: 50 ug via INTRAVENOUS

## 2015-02-16 MED ORDER — MIDAZOLAM HCL 2 MG/2ML IJ SOLN
INTRAMUSCULAR | Status: AC
Start: 2015-02-16 — End: 2015-02-16
  Filled 2015-02-16: qty 8

## 2015-02-16 MED ORDER — LIDOCAINE-EPINEPHRINE 2 %-1:100000 IJ SOLN
INTRAMUSCULAR | Status: AC
  Filled 2015-02-16: qty 1

## 2015-02-16 MED ORDER — LIDOCAINE HCL 1 % IJ SOLN
INTRAMUSCULAR | Status: AC
  Filled 2015-02-16: qty 20

## 2015-02-16 MED ORDER — IOHEXOL 300 MG/ML  SOLN
20.0000 mL | Freq: Once | INTRAMUSCULAR | Status: AC | PRN
  Administered 2015-02-16: 80 mL

## 2015-02-16 MED ORDER — MIDAZOLAM HCL 2 MG/2ML IJ SOLN
INTRAMUSCULAR | Status: AC | PRN
  Administered 2015-02-16: 0.5 mg via INTRAVENOUS
  Administered 2015-02-16: 1 mg via INTRAVENOUS
  Administered 2015-02-16 (×2): 0.5 mg via INTRAVENOUS
  Administered 2015-02-16: 1 mg via INTRAVENOUS
  Administered 2015-02-16 (×4): 0.5 mg via INTRAVENOUS
  Administered 2015-02-16: 1 mg via INTRAVENOUS

## 2015-02-16 NOTE — Care Management Note (Signed)
CARE MANAGEMENT NOTE 02/16/2015  Patient:  Eddie Roberts, Eddie Roberts   Account Number:  [id number]  Date Initiated:  02/16/2015  Documentation initiated by:  Marney Doctor  Subjective/Objective Assessment:   69 yo admitted with Lung Mass     Action/Plan:   From home with spouse   Anticipated DC Date:  02/19/2015   Anticipated DC Plan:  [place]  CM consult      Choice offered to / List presented to:             Status of service:  In process, will continue to follow Medicare Important Message given?   (If response is "NO", the following Medicare IM given date fields will be blank) Date Medicare IM given:   Medicare IM given by:   Date Additional Medicare IM given:   Additional Medicare IM given by:    Discharge Disposition:    Per UR Regulation:  Reviewed for med. necessity/level of care/duration of stay  If discussed at [place] of Stay Meetings, dates discussed:    Comments:  02/16/15 Marney Doctor RN,BSN,NCM [phone] Chart reviewed and CM following for DC needs.

## 2015-02-16 NOTE — Progress Notes (Signed)
Dressing to RUQ dry and intact

## 2015-02-16 NOTE — Procedures (Signed)
Attempted, but ultimately unsuccessful [place] and fluoroscopic guided placement of a right biliary approach biliary drain. D/W Dr. Watt Climes (GI) who will attempt placing a biliary stent via endoscopic approach later this week. No immediate complications.

## 2015-02-16 NOTE — Progress Notes (Signed)
3 small red spots on dressing

## 2015-02-16 NOTE — Progress Notes (Addendum)
[facility] Progress Note   Eddie Roberts BJS:[id number] DOB: 1946-10-05 DOA: 02/13/2015 PCP: Fae Pippin  Brief narrative: Eddie Roberts is a 69 y.o. male with a past medical history of recently diagnosed hypertension who presents for an abnormal CT. He has had generalized weakness for one month and anorexia and jaundice. He has lost about 10-15 pounds. He was called by the [facility] ER about the CT scan that was done on the day prior to presenting to our ER. He was told that cording to the CT, he was suspected to have cancer.  Subjective:  no change in symptoms of weakness and poor appetite. Pain in left upper back improves with Voltaren gel.  Assessment/Plan: Principal Problem:   Metastatic cancer- elevated LFTs with jaundice - Had a CT of the abdomen and pelvis on 4/7 at [place] reveals intrahepatic bile duct dilated patient and are concerning for an infiltrative neoplasm. There is hepatojugular and retroperitoneal lymphadenopathy concerning for metastatic disease-tail of the pancreas also appears abnormal - innumerable small pulmonary nodules- larger right lower lobe pulmonary mass measuring about 3 cm - According to above reports the primary source of the cancer is uncertain - CT head without contrast on 4/8 was negative for metastasis - Have consulted GI-DrSchooler has been in communication with interventional radiology-an MRCP performed last night reveals infiltrative tumor along the left hepatic lobe x-ray and extending into the right and left hepatic ducts and possibly also the portal vein. There is also nodularity in the peritoneum worrisome for transperitoneal spread and enlarged upper abdominal lymph nodes. Pulmonary metastasis and right lower lobe lung mass is also noted -Further management per GI and IR-  ADDENDUM-  Called by Dr Pascal Lux for update-despite multiple attempts, he was not able to get into biliary system to place percutaneous drain-  he has spoken with Dr Watt Climes who will attempt ERCP either tomorrow or the following day based on availability of room/ staff. Dr Kathlene Cote will be available over next 2 days if IR issues. He also notes the the left lobe of the liver is significantly atrophic.   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 - Also noted on above imaging-currently on a heparin infusion which will allow for procedures during the hospital stay- will need to further discuss with GI to see if this is tumor versus thrombus and whether heparin can be discontinued - ADDENDEM: have discussed this with Dr Pascal Lux- he does not feel that this is not a thrombus but more likely is cancer- will d/c Heparin infusion  Hypokalemia - Replace again today-repeat B met tomorrow- mg+ normal  Hypertension -Apparently this is mild and he was started on a low-dose of Norvasc recently at 2.5 mg daily at bedtime which I have continued  Left upper back pain This started about a month ago and he has been taking Tylenol for it- have started Voltaren gel 4 times a day which is helping   Appt with PCP: Code Status: Full code Family Communication:  Disposition Plan: To be determined DVT prophylaxis: Heparin infusion Consultants: GI-interventional radiology Procedures:  Antibiotics: Anti-infectives    Start     Dose/Rate Route Frequency Ordered Stop   02/16/15 1300  piperacillin-tazobactam (ZOSYN) 3.375 g in dextrose 5 % 50 mL IVPB     3.375 g 100 mL/hr over 30 Minutes Intravenous  Once 02/15/15 1620        Objective: Filed Weights   02/13/15 1844 02/13/15 2313  Weight: 129.729 kg (286 lb) 129.729 kg (286  lb)    Intake/Output Summary (Last 24 hours) at 02/16/15 1157 Last data filed at 02/16/15 1100  Gross per 24 hour  Intake 3598.33 ml  Output      0 ml  Net 3598.33 ml     Vitals Filed Vitals:   02/15/15 0516 02/15/15 1358 02/15/15 2100 02/16/15 0445  BP: 155/75 154/81 140/80 166/82  Pulse: 87 78 85 86  Temp: 97.5 F (36.4 C)  97.5 F (36.4 C) 97.6 F (36.4 C) 98.4 F (36.9 C)  TempSrc: Oral Oral Oral Oral  Resp: _0 Height:      Weight:      SpO2: 97% 96% 97% 95%    Exam:  General:  Pt is alert, not in acute distress- significantly jaundiced  HEENT: No icterus, No thrush  Cardiovascular: regular rate and rhythm, S1/S2 No murmur  Respiratory: clear to auscultation bilaterally   Abdomen: Soft, +Bowel sounds, non tender, non distended, no guarding  MSK: No LE edema, cyanosis or clubbing  Data Reviewed: Basic Metabolic Panel:  Recent Labs Lab 02/13/15 1801 02/14/15 0408 02/16/15 0425  NA 138 138 136  K 3.1* 3.2* 3.1*  CL 102 103 105  CO2 _1 GLUCOSE 123* 130* 122*  BUN _2 CREATININE 0.58 0.49* 0.37*  CALCIUM 8.9 8.4 8.0*  MG  --   --  2.0   Liver Function Tests:  Recent Labs Lab 02/13/15 1801 02/14/15 0408 02/15/15 1210 02/16/15 0425  AST 186* 169* 173* 155*  ALT 251* 223* 243* 220*  ALKPHOS 777* 725* 727* 672*  BILITOT 13.0* 12.4* 14.9* 14.1*  PROT 6.7 5.9* 6.0 5.9*  ALBUMIN 3.1* 2.7* 2.6* 2.4*    Recent Labs Lab 02/13/15 1801  LIPASE 110*   No results for input(s): AMMONIA in the last 168 hours. CBC:  Recent Labs Lab 02/13/15 1801 02/14/15 0408 02/15/15 0426 02/16/15 0425  WBC 9.6 9.3 8.4 8.9  NEUTROABS 7.8*  --   --   --   HGB 12.1* 11.6* 10.9* 10.9*  HCT 37.0* 34.9* 32.9* 33.0*  MCV 93.0 92.8 92.4 92.7  PLT 351 324 316 323   Cardiac Enzymes: No results for input(s): CKTOTAL, CKMB, CKMBINDEX, TROPONINI in the last 168 hours. BNP (last 3 results) No results for input(s): BNP in the last 8760 hours.  ProBNP (last 3 results) No results for input(s): PROBNP in the last 8760 hours.  CBG: No results for input(s): GLUCAP in the last 168 hours.  No results found for this or any previous visit (from the past 240 hour(s)).   Studies:  Recent x-ray studies have been reviewed in detail by the Attending Physician  Scheduled  Meds:  Scheduled Meds: . acetaminophen  1,000 mg Oral QHS  . amLODipine  2.5 mg Oral QHS  . diclofenac sodium  2 g Topical QID  . multivitamin with minerals  1 tablet Oral Daily  . piperacillin-tazobactam (ZOSYN)  IV  3.375 g Intravenous Once   Continuous Infusions: . sodium chloride 125 mL/hr at 02/14/15 0945  . heparin Stopped (02/16/15 0834)    Time spent on care of this patient: 35 minutes [place], MD 02/16/2015, 11:57 AM  LOS: 3 days   [facility] Office  [phone] Pager - Text Page per [email or web address]  If 7PM-7AM, please contact night-coverage [email or web address]

## 2015-02-16 NOTE — Progress Notes (Addendum)
ANTICOAGULATION CONSULT NOTE - Follow Up Consult  Pharmacy Consult for Heparin Indication: portal vein thrombosis  No Known Allergies  Patient Measurements: Height: _0  (185.4 cm) Weight: 286 lb (129.729 kg) IBW/kg (Calculated) : 79.9 Heparin Dosing Weight: 109 kg  Vital Signs: Temp: 98.4 F (36.9 C) (04/11 0445) Temp Source: Oral (04/11 0445) BP: 166/82 mmHg (04/11 0445) Pulse Rate: 86 (04/11 0445)  Labs:  Recent Labs  02/13/15 1801 02/13/15 1804 02/13/15 2330 02/14/15 0408  02/15/15 0426 02/15/15 1210 02/15/15 2030 02/16/15 0425  HGB 12.1*  --   --  11.6*  --  10.9*  --   --  10.9*  HCT 37.0*  --   --  34.9*  --  32.9*  --   --  33.0*  PLT 351  --   --  324  --  316  --   --  323  APTT  --   --  33  --   --   --   --   --   --   LABPROT  --  14.0  --   --   --   --   --   --   --   INR  --  1.07  --   --   --   --   --   --   --   HEPARINUNFRC  --   --   --  0.26*  < > 0.29* 0.20* 0.24* 0.54  CREATININE 0.58  --   --  0.49*  --   --   --   --  0.37*  < > = values in this interval not displayed.  Estimated Creatinine Clearance: 126.5 mL/min (by C-G formula based on Cr of 0.37).   Medical History: Past Medical History  Diagnosis Date  . Hypertension     Medications:  Scheduled:  . acetaminophen  1,000 mg Oral QHS  . amLODipine  2.5 mg Oral QHS  . diclofenac sodium  2 g Topical QID  . multivitamin with minerals  1 tablet Oral Daily  . piperacillin-tazobactam (ZOSYN)  IV  3.375 g Intravenous Once   Infusions:  . sodium chloride 125 mL/hr at 02/14/15 0945  . 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 Pt has a bile duct mass at the top of his bile duct and may need a biliary stent placement. Heparin to be started for portal vein  thrombosis.  Today, 02/16/2015  05:00 Heparin Level = 0.54 (subtherapeutic) with heparin infusing @ 2550 units/hr, required 4 rate increases from start of Heparin to maintain level therapeutic range  H/H low but stable, Plts wnl, no bleeding reported  Plan for IR procedure today  Goal of Therapy:  Heparin level 0.3-0.7 units/ml Monitor platelets by anticoagulation protocol: Yes   Plan:   Continue heparin at 2550 units/hr  Would recheck level to ensure we maintain a therapeutic level, but waiting for IR procedure  Daily heparin level and CBC  Plan for PTC with biliary drain placement today per IR, plan for procedure 1300, holding Heparin 2 hr prior.  Minda Ditto PharmD Pager [phone] 02/16/2015, 7:09 AM

## 2015-02-16 NOTE — Progress Notes (Signed)
Patient's hospital computer chart was reviewed and case discussed with my partner Dr. Michail Sermon and x-rays reviewed and case discussed with interventional radiology and agree with them proceeding with PTC today and I'll be on standby to help if a combined procedure or ERCP is needed in the future

## 2015-02-16 NOTE — Progress Notes (Signed)
2 small light red spots noted on dressing.

## 2015-02-16 NOTE — H&P (Signed)
Reason for Consult: Obstructive jaundice.  Chief Complaint: Chief Complaint  Patient presents with  . high bilirubin   . headaches     Referring Physician(s): GI  History of Present Illness: Eddie Roberts is a 69 y.o. male who presented with c/o weakness, fatigue, and dark colored urine. He was found to have obstructive jaundice secondary to a mass and also found to have possible lung metastasis. IR received request for image guided percutaneous biliary drain and brush biopsy. He denies any chest pain, resting shortness of breath or palpitations. He denies any active signs of bleeding or excessive bruising. He denies any current fever or chills. The patient denies any history of sleep apnea or chronic oxygen use. He has previously tolerated sedation without complications.    Past Medical History  Diagnosis Date  . Hypertension     History reviewed. No pertinent past surgical history.  Allergies: Review of patient's allergies indicates no known allergies.  Medications: Prior to Admission medications   Medication Sig Start Date End Date Taking? Authorizing Provider  acetaminophen (TYLENOL) 650 MG CR tablet Take 1,300 mg by mouth at bedtime.   Yes Historical Provider, MD  amLODipine (NORVASC) 2.5 MG tablet Take 2.5 mg by mouth at bedtime.   Yes Historical Provider, MD  Ascorbic Acid (VITAMIN C) 100 MG tablet Take 100 mg by mouth daily.   Yes Historical Provider, MD  Doxylamine Succinate, Sleep, (SLEEP AID PO) Take 2-3 tablets by mouth at bedtime.   Yes Historical Provider, MD  Multiple Vitamin (MULTIVITAMIN) tablet Take 1 tablet by mouth daily.   Yes Historical Provider, MD  doxycycline (VIBRA-TABS) 100 MG tablet Take 1 tablet (100 mg total) by mouth 2 (two) times daily. Patient not taking: Reported on 02/13/2015 03/22/14   Orma Flaming, MD     History reviewed. No pertinent family history.  History   Social History  . Marital Status: Single    Spouse Name: N/A  . Number  of Children: N/A  . Years of Education: N/A   Social History Main Topics  . Smoking status: Never Smoker   . Smokeless tobacco: Not on file  . Alcohol Use: No  . Drug Use: No  . Sexual Activity: Not on file   Other Topics Concern  . None   Social History Narrative    Review of Systems: A 12 point ROS discussed and pertinent positives are indicated in the HPI above.  All other systems are negative.  Review of Systems  Vital Signs: BP 166/82 mmHg  Pulse 86  Temp(Src) 98.4 F (36.9 C) (Oral)  Resp 24  Ht 6\' 1"  (1.854 m)  Wt 286 lb (129.729 kg)  BMI 37.74 kg/m2  SpO2 95%  Physical Exam  Constitutional: He is oriented to person, place, and time. No distress.  HENT:  Head: Normocephalic and atraumatic.  Eyes: Scleral icterus is present.  Neck: No tracheal deviation present.  Cardiovascular: Normal rate and regular rhythm.  Exam reveals no gallop and no friction rub.   No murmur heard. Pulmonary/Chest: Effort normal and breath sounds normal. No respiratory distress. He has no wheezes. He has no rales.  Abdominal: Soft. Bowel sounds are normal. He exhibits no distension. There is no tenderness.  Neurological: He is alert and oriented to person, place, and time.  Skin: He is not diaphoretic.    Mallampati Score:  MD Evaluation Airway: WNL Heart: WNL Abdomen: WNL Chest/ Lungs: WNL ASA  Classification: 3 Mallampati/Airway Score: Two  Imaging: Ct Abdomen  Pelvis W Wo Contrast  02/12/2015   CLINICAL DATA:  69 year old male with right upper quadrant abdominal pain since February 2016. Elevated AST and ALT, with jaundice. Dark urine. Evaluate for potential malignancy.  EXAM: CT ABDOMEN AND PELVIS WITHOUT AND WITH CONTRAST  TECHNIQUE: Multidetector CT imaging of the abdomen and pelvis was performed following the standard protocol before and following the bolus administration of intravenous contrast.  CONTRAST:  125 mL of Isovue-300.  COMPARISON:  No priors.  Abdominal  ultrasound 02/09/2015.  FINDINGS: Lower chest: 3.0 x 3.6 x 3.0 cm right lower lobe mass (image 12 of series 9). Multiple other smaller pulmonary nodules scattered throughout the lungs bilaterally. Trace bilateral pleural effusions layering dependently. Right hilar lymphadenopathy measuring up to 1.5 x 2.0 cm (image 4 of series 5). Multiple borderline enlarged and mildly enlarged juxta phrenic lymph nodes bilaterally measuring up to 1 cm in short axis adjacent to the right hemidiaphragm.  Hepatobiliary: Moderate intrahepatic biliary ductal dilatation. Common bile duct does not appear dilated, but is poorly visualized. Gallbladder is largely decompressed. Gallbladder wall appears thickened and edematous, measuring up to 1 cm in thickness. No definite gallstones. Hypoenhancement throughout the left lobe of the liver, presumably related to left portal vein thrombosis (see discussion below). Ill-defined soft tissue in the hepatic hilum adjacent to the centrally dilated bile ducts, concerning for an ill-defined infiltrative mass such as a cholangiocarcinoma. Some of this tissue appears to extend caudally into the porta hepatis coming in close contact to the pancreatic head, best appreciated on images 63 through 66 of series 5.  Pancreas: No definite pancreatic mass. However, the tail of the pancreas appears slightly more bulbous than the rest of the gland, and appears to be slightly hypoenhancing compared to the rest of the gland. No pancreatic ductal dilatation. No pancreatic or peripancreatic fluid or inflammatory changes.  Spleen: Unremarkable.  Adrenals/Urinary Tract: Tiny nonobstructive calculi in the collecting systems of the kidneys bilaterally, largest of which measures 5 mm in the lower pole collecting system of the left kidney. No ureteral stones or findings of urinary tract obstruction are noted at this time. No suspicious appearing cystic or solid renal lesions. Urinary bladder is normal in appearance.   Stomach/Bowel: The appearance of the stomach is normal. No pathologic dilatation of small bowel or colon. Normal appendix.  Vascular/Lymphatic: Main portal vein is mildly dilated measuring 17 mm in diameter. The main portal vein and right branches of the portal vein are patent. The left branch of portal vein is not visualized, and appears to be thrombosed. Atherosclerosis throughout the abdominal and pelvic vasculature, with mild aneurysmal dilatation of the right common iliac artery which measures up to 1.8 cm in diameter. Enlarged hepatoduodenal ligament lymph node measuring up to 12 mm in short axis. Several borderline enlarged and mildly enlarged retroperitoneal lymph nodes are noted, largest of which measure up to 12 mm in short axis in the upper retroperitoneum in the para-aortic and aortocaval nodal stations.  Reproductive: Prostate gland and seminal vesicles are unremarkable in appearance.  Other: Small volume of ascites predominantly in the low anatomic pelvis. No pneumoperitoneum. Small umbilical hernia containing omental fat.  Musculoskeletal: There are no aggressive appearing lytic or blastic lesions noted in the visualized portions of the skeleton.  IMPRESSION: 1. Highly unusual appearance of the liver, with moderate to severe intrahepatic biliary ductal dilatation, with poorly defined soft tissue in the central aspect of the liver, concerning for potential infiltrative neoplasm such ass cholangiocarcinoma. Correlation with MRI/MRCP with  and without IV gadolinium is recommended. 2. Hepatoduodenal ligament lymphadenopathy and retroperitoneal lymphadenopathy, concerning for metastatic disease. 3. Left portal vein thrombosis with hypoperfusion throughout the left lobe of the liver. 4. The tail of the pancreas appears slightly enlarged, and enhances to a lesser extent than the remainder of the head and body of the pancreas. No discrete mass is identified. Attention to this region at time of follow-up MRI is  recommended to exclude the possibility of a subtle infiltrative neoplasm. 5. Innumerable small pulmonary nodules, and larger right lower lobe pulmonary mass measuring 3.0 x 3.6 x 3.0 cm. Findings are favored to reflect metastatic disease to the lungs, however, the possibility of a primary right lower lobe bronchogenic neoplasm is not excluded. As part of the workup for the patient's underlying primary malignancy and to define the full extent of metastatic disease, these findings could be further evaluated with PET-CT and/or biopsy if clinically appropriate. 6. Additional incidental findings, as above.   Electronically Signed   By: Vinnie Langton M.D.   On: 02/12/2015 15:25   Dg Thoracic Spine 2 View  02/13/2015   CLINICAL DATA:  Thoracic pain. Unknown primary malignancy with likely lung metastases. No injury.  EXAM: THORACIC SPINE - 2 VIEW  COMPARISON:  None.  FINDINGS: Vertebral body alignment, heights and disc spaces are within normal. There is mild spondylosis throughout the thoracic spine. Pedicles are intact. There is no compression fracture or subluxation.  IMPRESSION: Mild spondylosis.  No acute findings.   Electronically Signed   By: Marin Olp M.D.   On: 02/13/2015 17:26   Ct Head Wo Contrast  02/13/2015   CLINICAL DATA:  Headaches.  Undiagnosed primary malignancy.  EXAM: CT HEAD WITHOUT CONTRAST  TECHNIQUE: Contiguous axial images were obtained from the base of the skull through the vertex without intravenous contrast.  COMPARISON:  None.  FINDINGS: Ventricles, cisterns and other CSF spaces are within normal. There is chronic ischemic microvascular disease. There is no mass, mass effect, shift of midline structures or acute hemorrhage. No evidence to suggest acute infarction. Mild bulbous prominence to the basilar tip as cannot exclude a small aneurysm. Near complete opacification of the right maxillary sinus. Remaining bony structures are within normal.  IMPRESSION: No acute intracranial  findings.  Chronic ischemic microvascular disease.  Bulbous prominence to the basilar artery tip as cannot exclude a small aneurysm. Consider CTA versus MRA for further evaluation.  Moderate chronic versus acute sinus inflammatory disease involving the right maxillary sinus.   Electronically Signed   By: Marin Olp M.D.   On: 02/13/2015 17:36   US Abdomen Complete  02/09/2015   CLINICAL DATA:  Right upper quadrant abdominal pain, jaundice, dark urine, no abdominal pain  EXAM: ULTRASOUND ABDOMEN COMPLETE  COMPARISON:  None  FINDINGS: Gallbladder: No gallstones or wall thickening visualized. No sonographic Murphy sign noted.  Common bile duct: Diameter: 4.8 mm  Liver: The hepatic echotexture is coarse. There is no focal mass. Minimal intrahepatic ductal dilation is present.  IVC: Partially obscured by bowel gas.  Pancreas: Completely obscured by bowel gas.  Spleen: Size and appearance within normal limits.  Right Kidney: Length: 11.6 cm. Echogenicity within normal limits. No mass or hydronephrosis visualized.  Left Kidney: Length: 13.5 cm. Echogenicity within normal limits. No mass or hydronephrosis visualized.  Abdominal aorta: No aneurysm visualized.  Other findings: There is no ascites.  IMPRESSION: 1. Heterogeneous hepatic echotexture with intrahepatic ductal dilation. There is no ultrasonic abnormality of the gallbladder or common bile  duct. 2. The pancreas is obscured by bowel gas. Further evaluation with abdominal and pelvic CT scanning is recommended. 3. The spleen, kidneys, and abdominal aorta are unremarkable.   Electronically Signed   By: David  Martinique   On: 02/09/2015 15:27   Mr 3d Recon At Scanner  02/14/2015   CLINICAL DATA:  Evaluate for cholangiocarcinoma. Jaundice and elevated LFTs.  EXAM: MRI ABDOMEN WITHOUT AND WITH CONTRAST (INCLUDING MRCP)  TECHNIQUE: Multiplanar multisequence MR imaging of the abdomen was performed both before and after the administration of intravenous contrast. Heavily  T2-weighted images of the biliary and pancreatic ducts were obtained, and three-dimensional MRCP images were rendered by post processing.  CONTRAST:  49mL MULTIHANCE GADOBENATE DIMEGLUMINE 529 MG/ML IV SOLN  COMPARISON:  02/12/2015  FINDINGS: Lower chest: Small left pleural effusion noted. Mass in the right hepatic lobe is noted, worrisome for metastatic disease.  Hepatobiliary: Abnormal signal is identified throughout the entire left lobe of liver. Exact margins of the obstructing tumor are difficult to identify. Central tumor involvement involves the biliary confluence resulting and obstruction of both the right and left hepatic ducts. The common bile duct appears to have a normal caliber. The main portal vein is patent. The portal venous branches to the right hepatic lobe are also patent. There is nonvisualization of the portal venous branches to the left lobe which are presumably occluded by tumor.  Pancreas: Normal appearance of the pancreas.  Spleen: Normal appearance of the spleen.  Adrenals/Urinary Tract: The adrenal glands are both normal. Unremarkable appearance of the right kidney. Several small cysts are noted within the upper pole of the left kidney  Stomach/Bowel: The stomach appears normal. The visualized upper abdominal bowel loops appear within normal limits. No evidence for bowel obstruction.  Vascular/Lymphatic: Normal caliber of the abdominal aorta. There are multiple prominent upper abdominal lymph nodes. Pre aortic lymph node measured 1.3 cm, image 61 of series 12004. Periaortic lymph node measures 1.2 cm, image 71 of series 12004. Aortocaval lymph node is enlarged measuring 1.3 cm, image 54 of series 12004.  Other: No significant ascites identified within the upper abdomen. Peritoneal nodularity is identified within the left side of abdomen,, image number 47 of series 12003. Additionally, there is nodular thickening along the left pericolic gutter  Musculoskeletal: Normal signal is identified  from within the bone marrow.  IMPRESSION: Exam detail is markedly diminished secondary to patient's body habitus and respiratory motion artifact.  1. Infiltrative tumor involving the left hepatic lobe noted. The margins of this tumor difficult to identify and measure. Tumor and extends centrally to obstruct both the right and left hepatic ducts at the confluence. There also appears to be occlusion of the left hepatic lobe branch of the portal vein. 2. Nodularity within the peritoneum is worrisome for trans peritoneal spread of tumor. 3. Enlarged upper abdominal lymph nodes worrisome for metastatic adenopathy. 4. Suspect pulmonary metastasis with right lower lobe lung mass.   Electronically Signed   By: Kerby Moors M.D.   On: 02/14/2015 19:22   Mr Abd W/wo Cm/mrcp  02/14/2015   CLINICAL DATA:  Evaluate for cholangiocarcinoma. Jaundice and elevated LFTs.  EXAM: MRI ABDOMEN WITHOUT AND WITH CONTRAST (INCLUDING MRCP)  TECHNIQUE: Multiplanar multisequence MR imaging of the abdomen was performed both before and after the administration of intravenous contrast. Heavily T2-weighted images of the biliary and pancreatic ducts were obtained, and three-dimensional MRCP images were rendered by post processing.  CONTRAST:  47mL MULTIHANCE GADOBENATE DIMEGLUMINE 529 MG/ML IV SOLN  COMPARISON:  02/12/2015  FINDINGS: Lower chest: Small left pleural effusion noted. Mass in the right hepatic lobe is noted, worrisome for metastatic disease.  Hepatobiliary: Abnormal signal is identified throughout the entire left lobe of liver. Exact margins of the obstructing tumor are difficult to identify. Central tumor involvement involves the biliary confluence resulting and obstruction of both the right and left hepatic ducts. The common bile duct appears to have a normal caliber. The main portal vein is patent. The portal venous branches to the right hepatic lobe are also patent. There is nonvisualization of the portal venous branches to the  left lobe which are presumably occluded by tumor.  Pancreas: Normal appearance of the pancreas.  Spleen: Normal appearance of the spleen.  Adrenals/Urinary Tract: The adrenal glands are both normal. Unremarkable appearance of the right kidney. Several small cysts are noted within the upper pole of the left kidney  Stomach/Bowel: The stomach appears normal. The visualized upper abdominal bowel loops appear within normal limits. No evidence for bowel obstruction.  Vascular/Lymphatic: Normal caliber of the abdominal aorta. There are multiple prominent upper abdominal lymph nodes. Pre aortic lymph node measured 1.3 cm, image 61 of series 12004. Periaortic lymph node measures 1.2 cm, image 71 of series 12004. Aortocaval lymph node is enlarged measuring 1.3 cm, image 54 of series 12004.  Other: No significant ascites identified within the upper abdomen. Peritoneal nodularity is identified within the left side of abdomen,, image number 47 of series 12003. Additionally, there is nodular thickening along the left pericolic gutter  Musculoskeletal: Normal signal is identified from within the bone marrow.  IMPRESSION: Exam detail is markedly diminished secondary to patient's body habitus and respiratory motion artifact.  1. Infiltrative tumor involving the left hepatic lobe noted. The margins of this tumor difficult to identify and measure. Tumor and extends centrally to obstruct both the right and left hepatic ducts at the confluence. There also appears to be occlusion of the left hepatic lobe branch of the portal vein. 2. Nodularity within the peritoneum is worrisome for trans peritoneal spread of tumor. 3. Enlarged upper abdominal lymph nodes worrisome for metastatic adenopathy. 4. Suspect pulmonary metastasis with right lower lobe lung mass.   Electronically Signed   By: Kerby Moors M.D.   On: 02/14/2015 19:22    Labs:  CBC:  Recent Labs  02/13/15 1801 02/14/15 0408 02/15/15 0426 02/16/15 0425  WBC 9.6 9.3  8.4 8.9  HGB 12.1* 11.6* 10.9* 10.9*  HCT 37.0* 34.9* 32.9* 33.0*  PLT 351 324 316 323    COAGS:  Recent Labs  02/13/15 1804 02/13/15 2330  INR 1.07  --   APTT  --  33    BMP:  Recent Labs  02/13/15 1801 02/14/15 0408 02/16/15 0425  NA 138 138 136  K 3.1* 3.2* 3.1*  CL 102 103 105  CO2 24 26 23   GLUCOSE 123* 130* 122*  BUN 11 10 7   CALCIUM 8.9 8.4 8.0*  CREATININE 0.58 0.49* 0.37*  GFRNONAA >90 >90 >90  GFRAA >90 >90 >90    LIVER FUNCTION TESTS:  Recent Labs  02/13/15 1801 02/14/15 0408 02/15/15 1210 02/16/15 0425  BILITOT 13.0* 12.4* 14.9* 14.1*  AST 186* 169* 173* 155*  ALT 251* 223* 243* 220*  ALKPHOS 777* 725* 727* 672*  PROT 6.7 5.9* 6.0 5.9*  ALBUMIN 3.1* 2.7* 2.6* 2.4*    Assessment and Plan: Weakness and fatigue Obstructive jaundice secondary to 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  GI is following and with CBD not being dilated they have requested percutaneous biliary catheter placement with brush biopsy with sedation today. The patient has been NPO, on IV heparin for portal vein thrombosis this has been turned off prior to procedure today, labs and vitals have been reviewed. Risks and Benefits discussed with the patient including, but not limited to bleeding, infection which may lead to sepsis or even death and damage to adjacent structures. All of the patient's questions were answered, patient is agreeable to proceed. Consent signed and in chart.    Thank you for this interesting consult.  I greatly enjoyed meeting Eddie Roberts and look forward to participating in their care.  SignedHedy Jacob 02/16/2015, 9:09 AM   I spent a total of 40 Minutes in face to face in clinical consultation, greater than 50% of which was counseling/coordinating care for obstructive jaundice.

## 2015-02-17 ENCOUNTER — Inpatient Hospital Stay (HOSPITAL_COMMUNITY): Payer: Medicare Other | Admitting: Anesthesiology

## 2015-02-17 ENCOUNTER — Encounter (HOSPITAL_COMMUNITY)
Admission: EM | Disposition: A | Discharge status: Other Acute Inpatient Hospital | Payer: Self-pay | Source: Home / Self Care | Attending: Internal Medicine

## 2015-02-17 ENCOUNTER — Inpatient Hospital Stay (HOSPITAL_COMMUNITY): Payer: Medicare Other

## 2015-02-17 ENCOUNTER — Encounter (HOSPITAL_COMMUNITY): Payer: Self-pay | Admitting: *Deleted

## 2015-02-17 HISTORY — PX: ERCP: SHX5425

## 2015-02-17 LAB — CBC
HEMATOCRIT: 31 % — AB (ref 39.0–52.0)
Hemoglobin: 10 g/dL — ABNORMAL LOW (ref 13.0–17.0)
MCH: 30 pg (ref 26.0–34.0)
MCHC: 32.3 g/dL (ref 30.0–36.0)
MCV: 93.1 fL (ref 78.0–100.0)
PLATELETS: 293 10*3/uL (ref 150–400)
RBC: 3.33 MIL/uL — ABNORMAL LOW (ref 4.22–5.81)
RDW: 17.5 % — AB (ref 11.5–15.5)
WBC: 8.9 10*3/uL (ref 4.0–10.5)

## 2015-02-17 LAB — COMPREHENSIVE METABOLIC PANEL
ALK PHOS: 590 U/L — AB (ref 39–117)
ALT: 222 U/L — AB (ref 0–53)
AST: 158 U/L — ABNORMAL HIGH (ref 0–37)
Albumin: 2.3 g/dL — ABNORMAL LOW (ref 3.5–5.2)
Anion gap: 7 (ref 5–15)
BILIRUBIN TOTAL: 14 mg/dL — AB (ref 0.3–1.2)
BUN: 9 mg/dL (ref 6–23)
CO2: 22 mmol/L (ref 19–32)
Calcium: 8.3 mg/dL — ABNORMAL LOW (ref 8.4–10.5)
Chloride: 107 mmol/L (ref 96–112)
Creatinine, Ser: 0.44 mg/dL — ABNORMAL LOW (ref 0.50–1.35)
GFR calc non Af Amer: >90 mL/min (ref >90)
GLUCOSE: 122 mg/dL — AB (ref 70–99)
Potassium: 3.4 mmol/L — ABNORMAL LOW (ref 3.5–5.1)
Sodium: 136 mmol/L (ref 135–145)
TOTAL PROTEIN: 5.5 g/dL — AB (ref 6.0–8.3)

## 2015-02-17 SURGERY — ERCP, WITH INTERVENTION IF INDICATED
Anesthesia: General

## 2015-02-17 MED ORDER — PHENYLEPHRINE 40 MCG/ML (10ML) SYRINGE FOR IV PUSH (FOR BLOOD PRESSURE SUPPORT)
PREFILLED_SYRINGE | INTRAVENOUS | Status: AC
  Filled 2015-02-17: qty 10

## 2015-02-17 MED ORDER — FENTANYL CITRATE 0.05 MG/ML IJ SOLN
INTRAMUSCULAR | Status: AC
  Filled 2015-02-17: qty 2

## 2015-02-17 MED ORDER — ALBUTEROL SULFATE HFA 108 (90 BASE) MCG/ACT IN AERS
INHALATION_SPRAY | RESPIRATORY_TRACT | Status: DC | PRN
  Administered 2015-02-17 (×2): 5 via RESPIRATORY_TRACT

## 2015-02-17 MED ORDER — PROPOFOL 10 MG/ML IV BOLUS
INTRAVENOUS | Status: DC | PRN
  Administered 2015-02-17: 200 mg via INTRAVENOUS

## 2015-02-17 MED ORDER — EPHEDRINE SULFATE 50 MG/ML IJ SOLN
INTRAMUSCULAR | Status: AC
  Filled 2015-02-17: qty 1

## 2015-02-17 MED ORDER — FENTANYL CITRATE 0.05 MG/ML IJ SOLN
INTRAMUSCULAR | Status: DC | PRN
  Administered 2015-02-17: 100 ug via INTRAVENOUS

## 2015-02-17 MED ORDER — MORPHINE SULFATE 4 MG/ML IJ SOLN
4.0000 mg | Freq: Once | INTRAMUSCULAR | Status: AC
  Administered 2015-02-17: 4 mg via INTRAVENOUS
  Filled 2015-02-17: qty 1

## 2015-02-17 MED ORDER — EPHEDRINE SULFATE 50 MG/ML IJ SOLN
INTRAMUSCULAR | Status: DC | PRN
  Administered 2015-02-17 (×3): 5 mg via INTRAVENOUS
  Administered 2015-02-17: 10 mg via INTRAVENOUS
  Administered 2015-02-17 (×3): 5 mg via INTRAVENOUS
  Administered 2015-02-17 (×2): 10 mg via INTRAVENOUS

## 2015-02-17 MED ORDER — PHENYLEPHRINE HCL 10 MG/ML IJ SOLN
10.0000 mg | INTRAVENOUS | Status: DC | PRN
  Administered 2015-02-17: 50 ug/min via INTRAVENOUS

## 2015-02-17 MED ORDER — PHENOL 1.4 % MT LIQD
1.0000 | OROMUCOSAL | Status: DC | PRN
  Filled 2015-02-17: qty 177

## 2015-02-17 MED ORDER — LIDOCAINE HCL (CARDIAC) 20 MG/ML IV SOLN
INTRAVENOUS | Status: AC
  Filled 2015-02-17: qty 5

## 2015-02-17 MED ORDER — ALBUTEROL SULFATE HFA 108 (90 BASE) MCG/ACT IN AERS
INHALATION_SPRAY | RESPIRATORY_TRACT | Status: AC
  Filled 2015-02-17: qty 6.7

## 2015-02-17 MED ORDER — CIPROFLOXACIN IN D5W 400 MG/200ML IV SOLN
INTRAVENOUS | Status: DC | PRN
Start: 2015-02-17 — End: 2015-02-17
  Administered 2015-02-17: 400 mg via INTRAVENOUS

## 2015-02-17 MED ORDER — LACTATED RINGERS IV SOLN
INTRAVENOUS | Status: DC
  Administered 2015-02-17: 14:00:00 via INTRAVENOUS
  Administered 2015-02-17: 1000 mL via INTRAVENOUS

## 2015-02-17 MED ORDER — PHENYLEPHRINE HCL 10 MG/ML IJ SOLN
INTRAMUSCULAR | Status: AC
  Filled 2015-02-17: qty 1

## 2015-02-17 MED ORDER — PHENYLEPHRINE HCL 10 MG/ML IJ SOLN
INTRAMUSCULAR | Status: DC | PRN
  Administered 2015-02-17: 160 ug via INTRAVENOUS
  Administered 2015-02-17: 80 ug via INTRAVENOUS
  Administered 2015-02-17: 160 ug via INTRAVENOUS
  Administered 2015-02-17: 120 ug via INTRAVENOUS
  Administered 2015-02-17 (×4): 80 ug via INTRAVENOUS
  Administered 2015-02-17 (×2): 120 ug via INTRAVENOUS

## 2015-02-17 MED ORDER — LIDOCAINE HCL (CARDIAC) 20 MG/ML IV SOLN
INTRAVENOUS | Status: DC | PRN
  Administered 2015-02-17: 100 mg via INTRAVENOUS

## 2015-02-17 MED ORDER — POTASSIUM CHLORIDE CRYS ER 20 MEQ PO TBCR
40.0000 meq | EXTENDED_RELEASE_TABLET | Freq: Once | ORAL | Status: DC
  Filled 2015-02-17: qty 2

## 2015-02-17 MED ORDER — PROPOFOL 10 MG/ML IV BOLUS
INTRAVENOUS | Status: AC
  Filled 2015-02-17: qty 20

## 2015-02-17 MED ORDER — GLUCAGON HCL RDNA (DIAGNOSTIC) 1 MG IJ SOLR
INTRAMUSCULAR | Status: AC
  Filled 2015-02-17: qty 1

## 2015-02-17 MED ORDER — CIPROFLOXACIN IN D5W 400 MG/200ML IV SOLN
INTRAVENOUS | Status: AC
  Filled 2015-02-17: qty 200

## 2015-02-17 MED ORDER — ONDANSETRON HCL 4 MG/2ML IJ SOLN
INTRAMUSCULAR | Status: AC
  Filled 2015-02-17: qty 2

## 2015-02-17 MED ORDER — AMLODIPINE BESYLATE 5 MG PO TABS
5.0000 mg | ORAL_TABLET | Freq: Every day | ORAL | Status: DC
  Administered 2015-02-18: 5 mg via ORAL
  Filled 2015-02-17 (×3): qty 1

## 2015-02-17 MED ORDER — SUCCINYLCHOLINE CHLORIDE 20 MG/ML IJ SOLN
INTRAMUSCULAR | Status: DC | PRN
  Administered 2015-02-17: 100 mg via INTRAVENOUS

## 2015-02-17 NOTE — Progress Notes (Addendum)
Patient ID: Eddie Roberts, male   DOB: July 27, 1946, 69 y.o.   MRN: [id number]  [facility] PROGRESS NOTE  JUSHUA WALTMAN IRW:[id number] DOB: July 27, 1946 DOA: 02/13/2015 PCP: Fae Pippin   Brief narrative:    Pt is 69 yo male with history of HTN, morbid obesity, long history of smoking quit in 1989, presented to [facility] ED with main concern of several weeks duration of progressively worsening fatigue and generalized weakness, poor oral intake, jaundice, generalized abd pain with nausea and intermittent episodes of vomiting.   In emergency department, patient was hemodynamically stable blood work notable for transaminitis, bilirubin 12.4. Patient admitted by [facility] hospitalist for evaluation of obstructive jaundice.  Assessment/Plan:    Active Problems:   Metastatic cancer in the setting of elevated LFTs and jaundice - Primary source of the cancer still uncertain, workup is in progress - CT abd 04/07 revealed intrahepatic bile duct dilation, hepatojugular lymphadenopathy, abnormally appearing pancreatic tail, lung nodules  ? Metastatic dz - MRCP 04/09 revealed infiltrative tumor involving the left hepatic lobe, tumor extension centrally obstructing right and left hepatic duct, ? pulmonary metastases - Please note that after multiple attempts by Dr. 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 - Also noted on CT abd 04/07 - started on heparin drip, placed on hold prior to intended biliary drain placement, resume post ERCP  - To be determined if this needs to be continued, per GI   Severe protein calorie malnutrition - In the context of acute on chronic illness outlined above - Currently on clear liquid diet and with persistent nausea - Plan for ERCP as noted above   Bibasilar crackles - Possible vascular congestion from IV fluids patient has been receiving since  admission - Currently on normal saline 125 mL an hour - Weight on admission 129 kg - We'll check weight this morning for trending purpose - Lower the rates to 50 mL an hour for now    Persistent nausea - In the setting of acute illness - Provide antiemetics as needed   Lung nodules - Appears to be metastatic process in the setting of malignant process, unclear primary source - once primary source is determined, we can plan on setting an outpatient follow up with cancer center    Obstructive jaundice with transaminitis  - In the setting of malignant process, primary source still undetermined as noted above - bilirubin is still elevated at 14 with persistent transaminitis  - work in progress, plan for ERCP today    Essential hypertension - SBP and 150s - Currently on Norvasc 2.5 mg by mouth daily - Will increase the dose to 5 mg for better blood pressure control   Hypokalemia - Mild, needs continued supplementation - Please note that magnesium is within normal limits - We will repeat BMP in the morning   Anemia of chronic disease, malignancy - Hg remains stable over the past 48 hours, no indication for transfusion at this time - Repeat CBC in the morning    Left upper back pain - Patient reports this has been chronic in nature, Tylenol adequate in pain control   Morbidly obese - Patient meets criteria for morbid obesity given risk factors of hypertension, metastatic cancer - Body mass index is 37.74  DVT prophylaxis - resume heparin drip post ERCP  Code Status: Full.  Family Communication:  plan of care discussed with the  patient and wife at bedside Disposition Plan: Will go home once medically clear   IV access:  Peripheral IV  Procedures and diagnostic studies:    Ct Abdomen Pelvis W Wo Contrast  02/12/2015    Highly unusual appearance of the liver, with moderate to severe intrahepatic biliary ductal dilatation, with poorly defined soft tissue in the central aspect of the  liver, concerning for potential infiltrative neoplasm such ass cholangiocarcinoma. Correlation with MRI/MRCP with and without IV gadolinium is recommended. 2. Hepatoduodenal ligament lymphadenopathy and retroperitoneal lymphadenopathy, concerning for metastatic disease. 3. Left portal vein thrombosis with hypoperfusion throughout the left lobe of the liver. 4. The tail of the pancreas appears slightly enlarged, and enhances to a lesser extent than the remainder of the head and body of the pancreas. No discrete mass is identified. Attention to this region at time of follow-up MRI is recommended to exclude the possibility of a subtle infiltrative neoplasm. 5. Innumerable small pulmonary nodules, and larger right lower lobe pulmonary mass measuring 3.0 x 3.6 x 3.0 cm. Findings are favored to reflect metastatic disease to the lungs, however, the possibility of a primary right lower lobe bronchogenic neoplasm is not excluded. As part of the workup for the patient's underlying primary malignancy and to define the full extent of metastatic disease, these findings could be further evaluated with PET-CT and/or biopsy if clinically appropriate.  Dg Thoracic Spine 2 View  02/13/2015    Mild spondylosis.  No acute findings.   Ct Head Wo Contrast  02/13/2015    No acute intracranial findings.  Chronic ischemic microvascular disease.  Bulbous prominence to the basilar artery tip as cannot exclude a small aneurysm. Consider CTA versus MRA for further evaluation.  Moderate chronic versus acute sinus inflammatory disease involving the right maxillary sinus.     US Abdomen Complete  02/09/2015   Heterogeneous hepatic echotexture with intrahepatic ductal dilation. There is no ultrasonic abnormality of the gallbladder or common bile duct. 2. The pancreas is obscured by bowel gas. Further evaluation with abdominal and pelvic CT scanning is recommended. 3. The spleen, kidneys, and abdominal aorta are unremarkable.     Mr 3d Recon At  Scanner  02/14/2015    Infiltrative tumor involving the left hepatic lobe noted.  Tumor extends centrally to obstruct both the right and left hepatic ducts at the confluence. Occlusion of the left hepatic lobe branch of the portal vein. 2. Nodularity within the peritoneum is worrisome for trans peritoneal spread of tumor. 3. Enlarged upper abdominal lymph nodes worrisome for metastatic adenopathy. 4. Suspect pulmonary metastasis with right lower lobe lung mass.     Ir Percutaneous Transhepatic Cholangiogram  02/16/2015   Attempted though ultimately unsuccessful placement of a percutaneous biliary tree due to failure to apically opacified the very mildly dilated right intrahepatic biliary system.  PLAN: Above was discussed with consultation gastroenterologist, Dr. Watt Climes, will attempt to perform biliary stent placement with potential biopsy via an endoscopic approach later this week.  If endoscopic approach also proves unsuccessful, would consider repeat percutaneous biliary drainage catheter placement with general anesthesia.     Medical Consultants:  [facility]  Other Consultants:  None  IAnti-Infectives:   None  Faye Ramsay, MD  [facility] Pager [phone]  If 7PM-7AM, please contact night-coverage [email or web address] Password [email or web address] 02/17/2015, 8:29 AM   LOS: 4 days   HPI/Subjective: Pt reports persistent nausea and right sided abd discomfort.   Objective: Filed Vitals:   02/16/15 1725 02/16/15 1822 02/16/15 2118 02/17/15 8299  BP: 132/72 149/88 147/76 150/71  Pulse: 94 83 92 87  Temp: 98 F (36.7 C) 98.9 F (37.2 C) 98.6 F (37 C) 98.3 F (36.8 C)  TempSrc: Axillary Oral Oral Oral  Resp: 24 24 22 20   Height:      Weight:      SpO2: 100% 99% 96% 95%    Intake/Output Summary (Last 24 hours) at 02/17/15 0829 Last data filed at 02/17/15 0600  Gross per 24 hour  Intake 6413.33 ml  Output      0 ml  Net 6413.33 ml    Exam:   General:  Pt is alert, follows commands appropriately,  not in acute distress, morbidly obese   Cardiovascular: Regular rate and rhythm, S1/S2, no murmurs, no rubs, no gallops  Respiratory: Bibasilar crackles, diminished breath sounds at bases   Abdomen: obese abdomen, tender in epigastric area, no guarding   Extremities: pulses DP and PT palpable bilaterally  Neuro: Grossly nonfocal  Data Reviewed: Basic Metabolic Panel:  Recent Labs Lab 02/13/15 1801 02/14/15 0408 02/16/15 0425 02/17/15 0436  NA 138 138 136 136  K 3.1* 3.2* 3.1* 3.4*  CL 102 103 105 107  CO2 24 26 23 22   GLUCOSE 123* 130* 122* 122*  BUN 11 10 7 9   CREATININE 0.58 0.49* 0.37* 0.44*  CALCIUM 8.9 8.4 8.0* 8.3*  MG  --   --  2.0  --    Liver Function Tests:  Recent Labs Lab 02/13/15 1801 02/14/15 0408 02/15/15 1210 02/16/15 0425 02/17/15 0436  AST 186* 169* 173* 155* 158*  ALT 251* 223* 243* 220* 222*  ALKPHOS 777* 725* 727* 672* 590*  BILITOT 13.0* 12.4* 14.9* 14.1* 14.0*  PROT 6.7 5.9* 6.0 5.9* 5.5*  ALBUMIN 3.1* 2.7* 2.6* 2.4* 2.3*    Recent Labs Lab 02/13/15 1801  LIPASE 110*   CBC:  Recent Labs Lab 02/13/15 1801 02/14/15 0408 02/15/15 0426 02/16/15 0425 02/17/15 0436  WBC 9.6 9.3 8.4 8.9 8.9  NEUTROABS 7.8*  --   --   --   --   HGB 12.1* 11.6* 10.9* 10.9* 10.0*  HCT 37.0* 34.9* 32.9* 33.0* 31.0*  MCV 93.0 92.8 92.4 92.7 93.1  PLT 351 324 316 323 293   Scheduled Meds: . acetaminophen  1,000 mg Oral QHS  . amLODipine  2.5 mg Oral QHS  . diclofenac sodium  2 g Topical QID   Continuous Infusions: . sodium chloride 125 mL/hr at 02/17/15 0700  . sodium chloride

## 2015-02-17 NOTE — Op Note (Signed)
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  The pentax [id number]  endoscope was introduced through the mouth and advanced to the second portion of the duodenum .passing the scope was difficult due to some fixation externally probably from the tumor but a normal-appearing ampulla was brought into view but based on the long position cannulation was difficult but we were able to get deep CBD cannulation without any pancreatic injections or wire advancement towards the pancreas in the distal and mid CBD was normal but then at the bifurcation there was minimal opacification and then some proximal dilated ducts were seen and there was some cystic duct and gallbladder filling and we were able to advance the wire into the proximal ducts we proceeded with a small to medium size sphincterotomy in the customary fashion and initially proceeded with brushing of the strictured area which unfortunately had a be done twice  due to one of the brushes failing and then we tried to place the 10 [place] 6 cm uncovered stent but could not advance it past the strictured area despite multiple attempts and so we exchanged the stent for the dilating balloon but again met the same problem of not being able to insert the balloon far enough and twice when we thought we were far enough we inflated the balloon which  migrated back to the distal duct and after our prolonged effort we elected to stop the procedure at this point the dilator balloon and the wire were removed and the patient tolerated the procedure well there was no obvious immediate complication and not mentioned above multiple times the wire was accidentally withdrawn back to the CBD and was able to be advanced proximally into the dilated segments.Estimated blood loss is zero unless otherwise noted in this procedure report.       COMPLICATIONS: none  ENDOSCOPIC IMPRESSION:1.bifurcation obstruction with dilated ducts proximally status post sphincterotomy and brushing but unable to advance either dilating catheter or stent as above #2.patent cystic duct and gallbladder filling 3. No pancreatic duct injection or wire advancement's  RECOMMENDATIONS:observe for delayed complications consider a CT directed biopsy if these brushings are nondiagnostic and consider a repeat trial of ERCP with him on his side which might make angulation easier or consider a University referral for further workup and plans     _______________________________ eSigned:  Clarene Essex, MD 02/17/2015 3:03 PM   CC:

## 2015-02-17 NOTE — Transfer of Care (Signed)
Immediate Anesthesia Transfer of Care Note  Patient: Eddie Roberts  Procedure(s) Performed: Procedure(s) (LRB): ENDOSCOPIC RETROGRADE CHOLANGIOPANCREATOGRAPHY (ERCP) (N/A)  Patient Location: PACU  Anesthesia Type: General  Level of Consciousness: sedated, patient cooperative and responds to stimulation  Airway & Oxygen Therapy: Patient Spontanous Breathing and Patient connected to face mask oxgen  Post-op Assessment: Report given to PACU RN and Post -op Vital signs reviewed and stable  Post vital signs: Reviewed and stable  Complications: No apparent anesthesia complications

## 2015-02-17 NOTE — Progress Notes (Signed)
PT Cancellation Note  Patient Details Name: Eddie Roberts MRN: [id number] DOB: 18-Mar-1946   Cancelled Treatment:    Reason Eval/Treat Not Completed: Patient at procedure or test/unavailable (endoscopy)   Javoris Star,KATHrine E 02/17/2015, 1:53 PM Carmelia Bake, PT, DPT 02/17/2015 Pager: [phone]

## 2015-02-17 NOTE — Anesthesia Preprocedure Evaluation (Signed)
Anesthesia Evaluation  Patient identified by MRN, date of birth, ID band Patient awake  General Assessment Comment:. Infiltrative tumor involving the left hepatic lobe noted. The margins of this tumor difficult to identify and measure. Tumor and extends centrally to obstruct both the right and left hepatic ducts at the confluence. There also appears to be occlusion of the left hepatic lobe branch of the portal vein. 2. Nodularity within the peritoneum is worrisome for trans peritoneal spread of tumor. 3. Enlarged upper abdominal lymph nodes worrisome for metastatic adenopathy. 4. Suspect pulmonary metastasis with right lower lobe lung mass.   Reviewed: Allergy & Precautions, NPO status , Patient's Chart, lab work & pertinent test results  Airway Mallampati: II  TM Distance: >3 FB Neck ROM: Full    Dental no notable dental hx.    Pulmonary neg pulmonary ROS,  breath sounds clear to auscultation  Pulmonary exam normal       Cardiovascular hypertension, Pt. on medications Rhythm:Regular Rate:Normal     Neuro/Psych negative neurological ROS  negative psych ROS   GI/Hepatic negative GI ROS, Neg liver ROS,   Endo/Other  Morbid obesity  Renal/GU negative Renal ROS  negative genitourinary   Musculoskeletal negative musculoskeletal ROS (+)   Abdominal   Peds negative pediatric ROS (+)  Hematology  (+) anemia ,   Anesthesia Other Findings   Reproductive/Obstetrics negative OB ROS                             Anesthesia Physical Anesthesia Plan  ASA: IV  Anesthesia Plan: General   Post-op Pain Management:    Induction: Intravenous  Airway Management Planned: Oral ETT  Additional Equipment:   Intra-op Plan:   Post-operative Plan: Extubation in OR  Informed Consent: I have reviewed the patients History and Physical, chart, labs and discussed the procedure including the risks,  benefits and alternatives for the proposed anesthesia with the patient or authorized representative who has indicated his/her understanding and acceptance.   Dental advisory given  Plan Discussed with: CRNA and Surgeon  Anesthesia Plan Comments:         Anesthesia Quick Evaluation

## 2015-02-17 NOTE — Anesthesia Postprocedure Evaluation (Signed)
  Anesthesia Post-op Note  Patient: Eddie Roberts  Procedure(s) Performed: Procedure(s) (LRB): ENDOSCOPIC RETROGRADE CHOLANGIOPANCREATOGRAPHY (ERCP) (N/A)  Patient Location: PACU  Anesthesia Type: MAC  Level of Consciousness: awake and alert   Airway and Oxygen Therapy: Patient Spontanous Breathing  Post-op Pain: mild  Post-op Assessment: Post-op Vital signs reviewed, Patient's Cardiovascular Status Stable, Respiratory Function Stable, Patent Airway and No signs of Nausea or vomiting  Last Vitals:  Filed Vitals:   02/17/15 1540  BP: 150/67  Pulse:   Temp:   Resp:     Post-op Vital Signs: stable   Complications: No apparent anesthesia complications

## 2015-02-17 NOTE — Progress Notes (Signed)
Eddie Roberts 12:42 PM  Subjective: Patient without any new complaints and his case was discussed extensively with he and his wife and yesterday with 2 different radiologists and my partner Dr. 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

## 2015-02-17 NOTE — Anesthesia Procedure Notes (Signed)
Procedure Name: Intubation Date/Time: 02/17/2015 12:54 PM Performed by: Deliah Boston Pre-anesthesia Checklist: Patient identified, Emergency Drugs available, Suction available and Patient being monitored Patient Re-evaluated:Patient Re-evaluated prior to inductionOxygen Delivery Method: Circle System Utilized Preoxygenation: Pre-oxygenation with 100% oxygen Intubation Type: IV induction Ventilation: Mask ventilation without difficulty Laryngoscope Size: Mac and 4 Grade View: Grade II Tube type: Oral Number of attempts: 1 Airway Equipment and Method: Oral airway Placement Confirmation: ETT inserted through vocal cords under direct vision,  positive ETCO2 and breath sounds checked- equal and bilateral Secured at: 23 cm Tube secured with: Tape Dental Injury: Teeth and Oropharynx as per pre-operative assessment

## 2015-02-17 NOTE — Progress Notes (Signed)
INITIAL NUTRITION ASSESSMENT  DOCUMENTATION CODES Per approved criteria  -Obesity Unspecified   INTERVENTION: RD to follow-up 4/13 to complete assessment  NUTRITION DIAGNOSIS: No nutrition diagnosis at this time.  Goal: Pt to meet >/= 90% of their estimated nutrition needs   Monitor:  Diet advancement, weight, labs, I/O's  Reason for Assessment: consult for nutritional assessment  Admitting Dx: Cancer of the liver and intrahepatic bile ducts  ASSESSMENT: 69 yo male with history of HTN, morbid obesity, long history of smoking quit in 1989, presented to [facility] ED with main concern of several weeks duration of progressively worsening fatigue and generalized weakness, poor oral intake, jaundice, generalized abd pain with nausea and intermittent episodes of vomiting.   RD attempted to speak with pt multiple times today but pt was not in his room. Per RN, pt is in endoscopy.  RD to complete assessment 4/13 when pt more available.  Labs reviewed: Low K Low Creatinine  Height: Ht Readings from Last 1 Encounters:  02/13/15 6\' 1"  (1.854 m)    Weight: Wt Readings from Last 1 Encounters:  02/17/15 291 lb (131.997 kg)    Ideal Body Weight: 184 lb  % Ideal Body Weight: 158%  Wt Readings from Last 10 Encounters:  02/17/15 291 lb (131.997 kg)  03/22/14 295 lb (133.811 kg)    Usual Body Weight:   % Usual Body Weight:   BMI:  Body mass index is 38.4 kg/(m^2).  Estimated Nutritional Needs: will complete assessment 4/13 Kcal:  Protein: Fluid:   Skin: intact  Diet Order: Diet NPO time specified  EDUCATION NEEDS: -No education needs identified at this time   Intake/Output Summary (Last 24 hours) at 02/17/15 1004 Last data filed at 02/17/15 0600  Gross per 24 hour  Intake 6413.33 ml  Output      0 ml  Net 6413.33 ml    Last BM: 4/12  Labs:   Recent Labs Lab 02/14/15 0408 02/16/15 0425 02/17/15 0436  NA 138 136 136  K 3.2* 3.1* 3.4*  CL 103 105 107  CO2  26 23 22   BUN 10 7 9   CREATININE 0.49* 0.37* 0.44*  CALCIUM 8.4 8.0* 8.3*  MG  --  2.0  --   GLUCOSE 130* 122* 122*    CBG (last 3)  No results for input(s): GLUCAP in the last 72 hours.  Scheduled Meds: . acetaminophen  1,000 mg Oral QHS  . amLODipine  5 mg Oral QHS  . diclofenac sodium  2 g Topical QID  . multivitamin with minerals  1 tablet Oral Daily  . potassium chloride  40 mEq Oral Once    Continuous Infusions: . sodium chloride 50 mL/hr at 02/17/15 0917  . sodium chloride      Past Medical History  Diagnosis Date  . Hypertension     History reviewed. No pertinent past surgical history.  Clayton Bibles, MS, RD, LDN Pager: [phone] After Hours Pager: [phone]

## 2015-02-18 ENCOUNTER — Encounter (HOSPITAL_COMMUNITY): Payer: Self-pay | Admitting: Gastroenterology

## 2015-02-18 DIAGNOSIS — R17 Unspecified jaundice: Secondary | ICD-10-CM

## 2015-02-18 DIAGNOSIS — R160 Hepatomegaly, not elsewhere classified: Secondary | ICD-10-CM

## 2015-02-18 LAB — CBC
HCT: 30.3 % — ABNORMAL LOW (ref 39.0–52.0)
HEMOGLOBIN: 9.8 g/dL — AB (ref 13.0–17.0)
MCH: 30.5 pg (ref 26.0–34.0)
MCHC: 32.3 g/dL (ref 30.0–36.0)
MCV: 94.4 fL (ref 78.0–100.0)
Platelets: 339 10*3/uL (ref 150–400)
RBC: 3.21 MIL/uL — ABNORMAL LOW (ref 4.22–5.81)
RDW: 18.4 % — ABNORMAL HIGH (ref 11.5–15.5)
WBC: 10.2 10*3/uL (ref 4.0–10.5)

## 2015-02-18 LAB — COMPREHENSIVE METABOLIC PANEL
ALBUMIN: 2.2 g/dL — AB (ref 3.5–5.2)
ALT: 193 U/L — AB (ref 0–53)
AST: 155 U/L — AB (ref 0–37)
Alkaline Phosphatase: 594 U/L — ABNORMAL HIGH (ref 39–117)
Anion gap: 10 (ref 5–15)
BUN: 10 mg/dL (ref 6–23)
CO2: 23 mmol/L (ref 19–32)
Calcium: 8.2 mg/dL — ABNORMAL LOW (ref 8.4–10.5)
Chloride: 106 mmol/L (ref 96–112)
Creatinine, Ser: 0.53 mg/dL (ref 0.50–1.35)
GFR calc Af Amer: >90 mL/min (ref >90)
GFR calc non Af Amer: >90 mL/min (ref >90)
Glucose, Bld: 125 mg/dL — ABNORMAL HIGH (ref 70–99)
POTASSIUM: 3.4 mmol/L — AB (ref 3.5–5.1)
SODIUM: 139 mmol/L (ref 135–145)
TOTAL PROTEIN: 5.3 g/dL — AB (ref 6.0–8.3)
Total Bilirubin: 16 mg/dL — ABNORMAL HIGH (ref 0.3–1.2)

## 2015-02-18 MED ORDER — POTASSIUM CHLORIDE CRYS ER 20 MEQ PO TBCR
60.0000 meq | EXTENDED_RELEASE_TABLET | Freq: Once | ORAL | Status: AC
  Administered 2015-02-18: 60 meq via ORAL
  Filled 2015-02-18: qty 3

## 2015-02-18 MED ORDER — BOOST / RESOURCE BREEZE PO LIQD
1.0000 | Freq: Three times a day (TID) | ORAL | Status: DC
  Administered 2015-02-18 – 2015-02-19 (×3): 1 via ORAL

## 2015-02-18 NOTE — Progress Notes (Signed)
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

## 2015-02-18 NOTE — Progress Notes (Signed)
[facility] PROGRESS NOTE   Eddie Roberts MCN:[id number] DOB: [birth year] DOA: 02/13/2015 PCP: Fae Pippin  HPI/Subjective: Seen with wife at bedside, questions answered. Wife has multiple questions about diagnosis, prognosis and possible second opinion. GI please advise if patient can be advanced to regular diet and about heparin anticoagulation.  Assessment/Plan: Principal Problem:   Liver mass Active Problems:   Lung mass   Metastatic cancer   Obstructive jaundice due to cancer   Portal vein thrombosis   Essential hypertension   Elevated LFTs   Jaundice   Obstructive jaundice    Metastatic cancer in the setting of elevated LFTs and jaundice - Primary source of the cancer still uncertain, workup is in progress - CT abd 04/07 revealed intrahepatic bile duct dilation, hepatojugular lymphadenopathy, abnormally appearing pancreatic tail, lung nodules ? Metastatic disease - MRCP 04/09 revealed infiltrative tumor involving the left hepatic lobe, tumor extension centrally obstructing right and left hepatic duct, ? pulmonary metastases - Unsuccessful attempts to place a percutaneous drain on 02/16/2015. - ERCP done on 02/17/2015, status post sphincterotomy and brushing, unable to advance the dilating catheter or stent.   Left portal vein thrombosis versus malignancy - Also noted on CT abd 04/07 - started on heparin drip, placed on hold prior to intended biliary drain placement, resume post ERCP  - GI please advise if anticoagulation is needed.   Severe protein calorie malnutrition - Likely secondary to metastatic disease, currently on clear liquids.   Bibasilar crackles - Weight is stable, patient is on 50 mL per hour of normal saline.   Persistent nausea - In the setting of acute illness - Provide antiemetics as needed   Lung nodules - Appears to be metastatic process in the setting of malignant process, unclear primary source - once primary source is  determined, we can plan on setting an outpatient follow up with cancer center    Obstructive jaundice with transaminitis  - In the setting of malignant process, primary source still undetermined as noted above - Total bilirubin increased to 16, persistent transaminitis. - Stent considered to be placed on Friday.   Essential hypertension - SBP and 150s - Currently on Norvasc 2.5 mg by mouth daily - Will increase the dose to 5 mg for better blood pressure control   Hypokalemia - Mild, needs continued supplementation - Replete with oral supplements, check BMP in a.m.   Anemia of chronic disease, malignancy - Hg remains stable over the past 48 hours, no indication for transfusion at this time - Repeat CBC in the morning    Left upper back pain - Patient reports this has been chronic in nature, Tylenol adequate in pain control   Morbidly obese - Patient meets criteria for morbid obesity given risk factors of hypertension, metastatic cancer - Body mass index is 37.74  Code Status: Full Code Family Communication: Plan discussed with the patient. Disposition Plan: Remains inpatient Diet: Diet clear liquid Room service appropriate?: Yes; Fluid consistency:: Thin  Consultants:  GI  Procedures:  None  Antibiotics:  None   Objective: Filed Vitals:   02/18/15 0552  BP: 163/88  Pulse: 106  Temp:   Resp: 21    Intake/Output Summary (Last 24 hours) at 02/18/15 1039 Last data filed at 02/18/15 0600  Gross per 24 hour  Intake 3146.25 ml  Output      0 ml  Net 3146.25 ml   Filed Weights   02/13/15 2313 02/17/15 0950 02/17/15 1155  Weight: 129.729 kg (286 lb) 131.997 kg (  291 lb) 131.997 kg (291 lb)    Exam: General: Alert and awake, oriented x3, not in any acute distress. HEENT: anicteric sclera, pupils reactive to light and accommodation, EOMI CVS: S1-S2 clear, no murmur rubs or gallops Chest: clear to auscultation bilaterally, no wheezing, rales or  rhonchi Abdomen: soft nontender, nondistended, normal bowel sounds, no organomegaly Extremities: no cyanosis, clubbing or edema noted bilaterally Neuro: Cranial nerves II-XII intact, no focal neurological deficits  Data Reviewed: Basic Metabolic Panel:  Recent Labs Lab 02/13/15 1801 02/14/15 0408 02/16/15 0425 02/17/15 0436 02/18/15 0353  NA 138 138 136 136 139  K 3.1* 3.2* 3.1* 3.4* 3.4*  CL 102 103 105 107 106  CO2 24 26 23 22 23   GLUCOSE 123* 130* 122* 122* 125*  BUN 11 10 7 9 10   CREATININE 0.58 0.49* 0.37* 0.44* 0.53  CALCIUM 8.9 8.4 8.0* 8.3* 8.2*  MG  --   --  2.0  --   --    Liver Function Tests:  Recent Labs Lab 02/14/15 0408 02/15/15 1210 02/16/15 0425 02/17/15 0436 02/18/15 0353  AST 169* 173* 155* 158* 155*  ALT 223* 243* 220* 222* 193*  ALKPHOS 725* 727* 672* 590* 594*  BILITOT 12.4* 14.9* 14.1* 14.0* 16.0*  PROT 5.9* 6.0 5.9* 5.5* 5.3*  ALBUMIN 2.7* 2.6* 2.4* 2.3* 2.2*    Recent Labs Lab 02/13/15 1801  LIPASE 110*   No results for input(s): AMMONIA in the last 168 hours. CBC:  Recent Labs Lab 02/13/15 1801 02/14/15 0408 02/15/15 0426 02/16/15 0425 02/17/15 0436 02/18/15 0353  WBC 9.6 9.3 8.4 8.9 8.9 10.2  NEUTROABS 7.8*  --   --   --   --   --   HGB 12.1* 11.6* 10.9* 10.9* 10.0* 9.8*  HCT 37.0* 34.9* 32.9* 33.0* 31.0* 30.3*  MCV 93.0 92.8 92.4 92.7 93.1 94.4  PLT 351 324 316 323 293 339   Cardiac Enzymes: No results for input(s): CKTOTAL, CKMB, CKMBINDEX, TROPONINI in the last 168 hours. BNP (last 3 results) No results for input(s): BNP in the last 8760 hours.  ProBNP (last 3 results) No results for input(s): PROBNP in the last 8760 hours.  CBG: No results for input(s): GLUCAP in the last 168 hours.  Micro No results found for this or any previous visit (from the past 240 hour(s)).   Studies: Dg Ercp Biliary & Pancreatic Ducts  02/17/2015   CLINICAL DATA:  69 year old male with a history of cholelithiasis.  EXAM: ERCP   TECHNIQUE: Multiple spot images obtained with the fluoroscopic device and submitted for interpretation post-procedure.  FLUOROSCOPY TIME:  If the device does not provide the exposure index:  Fluoroscopy Time:  Op note  Number of Acquired Images:  8  COMPARISON:  MRI 02/14/2015, fluoroscopy study 02/16/2015, CT 02/12/2015  FINDINGS: Limited fluoroscopic images during ERCP.  Endoscope projects over the upper abdomen. There is cannulation of the ampulla with small amount of retrograde infusion of contrast opacifying the distal common bile duct.  A guidewire is present within the ductal system.  No significant opacification of the biliary ducts, with a small amount of contrast appearing to enter the cystic duct, gallbladder, and the intrahepatic ducts.  IMPRESSION: ERCP demonstrates partial opacification of the extrahepatic biliary ductal system and gallbladder.  Please refer to the dictated operative report for full details of intraoperative findings and procedure.  Signed,  Dulcy Fanny. Earleen Newport, DO  [facility]  [facility]   Electronically Signed   By: York Cerise  Earleen Newport D.O.   On: 02/17/2015 17:15   Ir Percutaneous Transhepatic Cholangiogram  02/16/2015   INDICATION: Obstructive, presumably malignant jaundice with ill-defined mass nearly replacing the left lobe of the liver resulting in mild centralized intrahepatic biliary duct dilatation within the right lobe of the liver.  Prolonged discussions were held with referring gastroenterologist, Dr. Watt Climes, who felt preceding with ERCP would be of limited utility secondary to the hilar location of the obstructive mass. As such, request was made for placement of a percutaneous biliary drainage catheter.  EXAM: ULTRASOUND AND FLUOROSCOPIC GUIDED PERCUTANEOUS TRANSHEPATIC CHOLANGIOGRAM AND BILIARY TUBE PLACEMENT  COMPARISON:  CT abdomen pelvis - 02/12/2015; MRCP - 02/14/2015  MEDICATIONS: Zosyn 3.375 G IV; the antibiotic was  administered within an appropriate time frame prior to skin puncture.  CONTRAST:  35mL OMNIPAQUE IOHEXOL 300 MG/ML  SOLN  ANESTHESIA/SEDATION: Versed 6.5 mg IV; fentanyl 100 mcg IV  Total Moderate Sedation Time  77 minutes  FLUOROSCOPY TIME:  19 minutes, 6 seconds (1688 mGy).  COMPLICATIONS: None immediate  TECHNIQUE: Informed written consent was obtained from the patient and the patient's family after a discussion of the risks, benefits and alternatives to treatment. Questions regarding the procedure were encouraged and answered. A timeout was performed prior to the initiation of the procedure.  The right upper abdominal quadrant was prepped and draped in the usual sterile fashion, and a sterile drape was applied covering the operative field. Maximum barrier sterile technique with sterile gowns and gloves were used for the procedure. A timeout was performed prior to the initiation of the procedure.  Ultrasound scanning of the right upper abdominal quadrant was performed to delineate the anatomy and avoid transgression of the gallbladder or the pleural. A spot along the right mid axillary line was marked fluoroscopically inferior to the right costophrenic angle.  At the overlying soft tissues were anesthetized with 1% lidocaine with epinephrine, multiple passes were made with a 21 gauge Chiba needle directed towards the central aspect of the right lobe of the liver both with ultrasound and fluoroscopic guidance, however the biliary tree could never be adequately opacified. As such, a biliary drainage catheter was not placed.  The patient tolerated the above attempted procedure well without immediate postprocedural complication.  FINDINGS: Ultrasound scanning failed to delineate significant dilatation of the right biliary tree.  Despite multiple attempts, both with ultrasound and fluoroscopic guidance, the biliary tree could not be adequately opacified and as such, a biliary drain could not be placed from a  percutaneous approach.  Note, the examination with markedly degraded secondary to patient body habitus as well as significant respiratory excursion.  IMPRESSION: Attempted though ultimately unsuccessful placement of a percutaneous biliary tree due to failure to apically opacified the very mildly dilated right intrahepatic biliary system.  PLAN: Above was discussed with consultation gastroenterologist, Dr. Watt Climes, will attempt to perform biliary stent placement with potential biopsy via an endoscopic approach later this week.  If endoscopic approach also proves unsuccessful, would consider repeat percutaneous biliary drainage catheter placement with general anesthesia.   Electronically Signed   By: Sandi Mariscal M.D.   On: 02/16/2015 16:23    Scheduled Meds: . acetaminophen  1,000 mg Oral QHS  . amLODipine  5 mg Oral QHS  . diclofenac sodium  2 g Topical QID  . multivitamin with minerals  1 tablet Oral Daily  . potassium chloride  40 mEq Oral Once   Continuous Infusions: . sodium chloride 50 mL/hr at 02/18/15 0439  Time spent: 35 minutes    [facility] A  [facility] Pager [phone] If 7PM-7AM, please contact night-coverage at [email or web address], password [facility] 02/18/2015, 10:39 AM  LOS: 5 days

## 2015-02-18 NOTE — Progress Notes (Signed)
NUTRITION FOLLOW-UP  DOCUMENTATION CODES Per approved criteria  -Obesity Unspecified   INTERVENTION: -Provide Resource Breeze po TID, each supplement provides 250 kcal and 9 grams of protein -Ordered italian ice for pt Encourage PO intake RD to continue to monitor  NUTRITION DIAGNOSIS: Inadequate oral intake related to N/V as evidenced by poor PO intake <25% x 7 days.  Goal: Pt to meet >/= 90% of their estimated nutrition needs   Monitor:  Diet advancement, weight, labs, I/O's  Reason for Assessment: consult for nutritional assessment  Admitting Dx: Liver mass  ASSESSMENT: 69 yo male with history of HTN, morbid obesity, long history of smoking quit in 1989, presented to [facility] ED with main concern of several weeks duration of progressively worsening fatigue and generalized weakness, poor oral intake, jaundice, generalized abd pain with nausea and intermittent episodes of vomiting.   Pt in room with family and pastor at bedside. Pt lying in bed, did not provide much history. Per wife, pt has not been eating in the past week. Pt "forced down" a roast beef sandwich and a few curly fries from [employer] on 4/7. Pt's weight has remained stable.  Pt is willing to try Resource Breeze supplements and states that he is planning on trying some broth tonight. Pt's wife requested lemon [place] ice be ordered in the meantime since he likes those. RD ordered.  Nutrition focused physical exam shows no sign of depletion of muscle mass or body fat.  Labs reviewed: Low K  Height: Ht Readings from Last 1 Encounters:  02/17/15 6\' 1"  (1.854 m)    Weight: Wt Readings from Last 1 Encounters:  02/17/15 291 lb (131.997 kg)    Ideal Body Weight: 184 lb  % Ideal Body Weight: 158%  Wt Readings from Last 10 Encounters:  02/17/15 291 lb (131.997 kg)  03/22/14 295 lb (133.811 kg)    Usual Body Weight: 292-294 lb  % Usual Body Weight: 100%  BMI:  Body mass index is 38.4 kg/(m^2).  Estimated  Nutritional Needs:  Kcal: 0254-2706 Protein:120-130g Fluid: 2.4L/day  Skin: intact  Diet Order: Diet clear liquid Room service appropriate?: Yes; Fluid consistency:: Thin  EDUCATION NEEDS: -No education needs identified at this time   Intake/Output Summary (Last 24 hours) at 02/18/15 1048 Last data filed at 02/18/15 0600  Gross per 24 hour  Intake 3146.25 ml  Output      0 ml  Net 3146.25 ml    Last BM: 4/12  Labs:   Recent Labs Lab 02/16/15 0425 02/17/15 0436 02/18/15 0353  NA 136 136 139  K 3.1* 3.4* 3.4*  CL 105 107 106  CO2 23 22 23   BUN 7 9 10   CREATININE 0.37* 0.44* 0.53  CALCIUM 8.0* 8.3* 8.2*  MG 2.0  --   --   GLUCOSE 122* 122* 125*    CBG (last 3)  No results for input(s): GLUCAP in the last 72 hours.  Scheduled Meds: . acetaminophen  1,000 mg Oral QHS  . amLODipine  5 mg Oral QHS  . diclofenac sodium  2 g Topical QID  . multivitamin with minerals  1 tablet Oral Daily  . potassium chloride  40 mEq Oral Once    Continuous Infusions: . sodium chloride 50 mL/hr at 02/18/15 0439    Past Medical History  Diagnosis Date  . Hypertension     History reviewed. No pertinent past surgical history.  Clayton Bibles, MS, RD, LDN Pager: [phone] After Hours Pager: [phone]

## 2015-02-18 NOTE — Evaluation (Signed)
Physical Therapy One Time Evaluation Patient Details Name: Eddie Roberts MRN: [id number] DOB: 03/10/1946 Today's Date: 02/18/2015   History of Present Illness  Pt is a 69 year old male admitted with generalized weakness and jaundice with work up for metastatic cancer and s/p ERCP on 02/17/2015, status post sphincterotomy and brushing, unable to advance the dilating catheter or stent    Clinical Impression  Patient evaluated by Physical Therapy with no further acute PT needs identified. All education has been completed and the patient has no further questions.  Pt reports his mobility is good for current condition, as he does not expect to be ambulating farther or stronger until medical issues resolved and able to have regular diet.  Pt states he has been ambulating to/from bathroom with nursing. Pt reports understanding of acute PT and agreeable to ask for re-order if he becomes weaker and/or has poor mobility.  Pt considering HHPT depending upon further hospital work up however PT recommends HHPT at this time.   Pt also has a flight to bedroom and shower so if still not feeling well upon d/c, may need hospital bed (also owns one however unknown current condition as it is in storage). PT is signing off. Thank you for this referral.        Follow Up Recommendations Home health PT    Equipment Recommendations  None recommended by PT (may need hospital bed if pt cannot perform steps (unknown condition of pt's hospital bed))    Recommendations for Other Services       Precautions / Restrictions Precautions Precautions: Fall      Mobility  Bed Mobility Overal bed mobility: Modified Independent                Transfers Overall transfer level: Needs assistance   Transfers: Sit to/from Stand Sit to Stand: Min guard;Supervision            Ambulation/Gait Ambulation/Gait assistance: Min guard Ambulation Distance (Feet): 80 Feet Assistive device: None (pushed IV pole) Gait  Pattern/deviations: Step-through pattern;Decreased stride length Gait velocity: decr   General Gait Details: increased dyspnea upon return to room, pt reports generalized weakness and fatigue however states he is only on clear liquids and does not feel well  Stairs            Wheelchair Mobility    Modified Rankin (Stroke Patients Only)       Balance Overall balance assessment:  (reports no falls last 3 months)                                           Pertinent Vitals/Pain Pain Assessment: No/denies pain    Home Living Family/patient expects to be discharged to:: Private residence Living Arrangements: Spouse/significant other   Type of Home: House Home Access: Stairs to enter   Technical brewer of Steps: 5-6 Home Layout: Two level;Bed/bath upstairs Home Equipment: Hospital bed (unknown condition of hospital bed)      Prior Function Level of Independence: Independent               Hand Dominance        Extremity/Trunk Assessment               Lower Extremity Assessment: Generalized weakness         Communication   Communication: No difficulties  Cognition Arousal/Alertness: Awake/alert Behavior During Therapy: [name] for  tasks assessed/performed Overall Cognitive Status: Within Functional Limits for tasks assessed                      General Comments      Exercises        Assessment/Plan    PT Assessment All further PT needs can be met in the next venue of care  PT Diagnosis Generalized weakness   PT Problem List Decreased strength;Decreased activity tolerance;Decreased mobility;Decreased knowledge of use of DME  PT Treatment Interventions     PT Goals (Current goals can be found in the Care Plan section) Acute Rehab PT Goals PT Goal Formulation: All assessment and education complete, DC therapy    Frequency     Barriers to discharge        Co-evaluation               End of Session  Equipment Utilized During Treatment: Gait belt Activity Tolerance: Patient limited by fatigue Patient left: in bed;with call bell/phone within reach;with family/visitor present           Time: 1039-1105 PT Time Calculation (min) (ACUTE ONLY): 26 min   Charges:   PT Evaluation $Initial PT Evaluation Tier I: 1 Procedure     PT G Codes:        Eddie Roberts,KATHrine E 02/18/2015, 3:04 PM Carmelia Bake, PT, DPT 02/18/2015 Pager: [phone]

## 2015-02-19 LAB — COMPREHENSIVE METABOLIC PANEL
ALBUMIN: 2.2 g/dL — AB (ref 3.5–5.2)
ALK PHOS: 634 U/L — AB (ref 39–117)
ALT: 175 U/L — AB (ref 0–53)
AST: 137 U/L — ABNORMAL HIGH (ref 0–37)
Anion gap: 6 (ref 5–15)
BILIRUBIN TOTAL: 17.6 mg/dL — AB (ref 0.3–1.2)
BUN: 9 mg/dL (ref 6–23)
CHLORIDE: 108 mmol/L (ref 96–112)
CO2: 24 mmol/L (ref 19–32)
Calcium: 8 mg/dL — ABNORMAL LOW (ref 8.4–10.5)
Creatinine, Ser: 0.44 mg/dL — ABNORMAL LOW (ref 0.50–1.35)
GFR calc Af Amer: >90 mL/min (ref >90)
GFR calc non Af Amer: >90 mL/min (ref >90)
Glucose, Bld: 119 mg/dL — ABNORMAL HIGH (ref 70–99)
Potassium: 3.3 mmol/L — ABNORMAL LOW (ref 3.5–5.1)
SODIUM: 138 mmol/L (ref 135–145)
TOTAL PROTEIN: 5.3 g/dL — AB (ref 6.0–8.3)

## 2015-02-19 NOTE — Progress Notes (Signed)
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

## 2015-02-19 NOTE — Progress Notes (Signed)
Report called to [facility] at [facility] on 9300. Phone [phone].

## 2015-02-19 NOTE — Progress Notes (Signed)
Spoke with Pamala Hurry at [facility] office to let them know we can not do PET scan on patient while he is an inpatient.

## 2015-02-19 NOTE — Discharge Summary (Signed)
Physician Discharge Summary  Eddie Roberts:[id number] DOB: 12-04-1945 DOA: 02/13/2015  PCP: Fae Pippin  Admit date: 02/13/2015 Discharge date: 02/19/2015  Time spent: 40 minutes  Recommendations for Outpatient Follow-up:  1. Patient to be transferred to [facility], spoke with Dr. Pablo Lawrence. 2. Patient reason for transfer: to see biliary service and family request as well.  Discharge Diagnoses:  Principal Problem:   Liver mass Active Problems:   Lung mass   Metastatic cancer   Obstructive jaundice due to cancer   Portal vein thrombosis   Essential hypertension   Elevated LFTs   Jaundice   Obstructive jaundice   Discharge Condition: Stable  Diet recommendation: Heart healthy  Filed Weights   02/17/15 0950 02/17/15 1155 02/19/15 0506  Weight: 131.997 kg (291 lb) 131.997 kg (291 lb) 134.764 kg (297 lb 1.6 oz)    History of present illness:  Eddie Roberts is a 69 y.o. male who presents to the ED with c/o 1 week history of generalized weakness, anorexia, worsening jaundice, abdominal pain. Symptoms are worsening since onset a week ago. He had been feeling "unwell" for a couple of months but didn't see a doctor until he started developing jaundice a week ago.  Yesterday the cause of his symptoms was revealed during CT scan abd/pelvis in the [place] ED (findings documented below).  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 Metastatic disease - MRCP 04/09 revealed infiltrative tumor involving the left hepatic lobe, tumor extension centrally obstructing right and left hepatic duct, ? pulmonary metastases - Unsuccessful attempts to place a percutaneous drain on 02/16/2015. - ERCP done on 02/17/2015,  status post sphincterotomy and brushing, unable to advance the dilating catheter or stent. - Pathology come back positive for adenocarcinoma, source still unclear, cannot rule out cholangiocarcinoma.   Left portal vein thrombosis versus malignancy - Also noted on CT abd 04/07 - started on heparin drip, placed on hold prior to intended biliary drain placement, resume post ERCP  - Currently not on any anticoagulation, unable to differentiate if this clot versus tumor.   Severe protein calorie malnutrition - Likely secondary to metastatic disease, currently on clear liquids.   Bibasilar crackles - Weight is stable, has lower extremity edema, IV fluids discontinued. - Lasix as needed if develops shortness of breath.   Persistent nausea - In the setting of acute illness - Provide antiemetics as needed   Lung nodules - Appears to be metastatic process in the setting of malignant process, unclear primary source - once primary source is determined, we can plan on setting an outpatient follow up with cancer center    Obstructive jaundice with transaminitis  - In the setting of malignant process, primary source still undetermined as noted above, cannot rule out cholangiocarcinoma. - Total bilirubin increased to 16, persistent transaminitis.   Essential hypertension - SBP and 150s - Currently on Norvasc 2.5 mg by mouth daily - Will increase the dose to 5 mg for better blood pressure control   Hypokalemia - Mild, needs continued supplementation - Replete with oral supplements, check BMP in a.m.   Anemia of chronic disease, malignancy - Hg remains stable over the past 48 hours, no indication for transfusion at this time - Repeat CBC in the morning    Left upper back pain - Patient  reports this has been chronic in nature, Tylenol adequate in pain control   Morbidly obese - Patient meets criteria for morbid obesity given risk factors of hypertension, metastatic cancer -  Body mass index is 37.74   Procedures:  ERCP done by Dr. Daisey Must on 02/17/2015, attempted but unsuccessful balloon dilatation, attempted but unsuccessful stent placement. Successful brushing.  Attempted but unsuccessful right biliary approach biliary drain done by Dr. Pascal Lux on 02/16/2015  Path:  Diagnosis BILE DUCT BRUSHING(SPECIMEN 1 OF 1 COLLECTED 02/17/15): MALIGNANT CELLS CONSISTENT WITH ADENOCARCINOMA.  Consultations:  Gastroenterology, Dr. Watt Climes.  Interventional radiology Dr. Pascal Lux.  Discharge Exam: Filed Vitals:   02/19/15 0506  BP: 141/72  Pulse: 80  Temp: 98 F (36.7 C)  Resp: 20   General: Alert and awake, oriented x3, not in any acute distress. HEENT: anicteric sclera, pupils reactive to light and accommodation, EOMI CVS: S1-S2 clear, no murmur rubs or gallops Chest: clear to auscultation bilaterally, no wheezing, rales or rhonchi Abdomen: soft nontender, nondistended, normal bowel sounds, no organomegaly Extremities: no cyanosis, clubbing or edema noted bilaterally Neuro: Cranial nerves II-XII intact, no focal neurological deficits  Discharge Instructions    Current Discharge Medication List    CONTINUE these medications which have NOT CHANGED   Details  acetaminophen (TYLENOL) 650 MG CR tablet Take 1,300 mg by mouth at bedtime.    amLODipine (NORVASC) 2.5 MG tablet Take 2.5 mg by mouth at bedtime.    Ascorbic Acid (VITAMIN C) 100 MG tablet Take 100 mg by mouth daily.    Doxylamine Succinate, Sleep, (SLEEP AID PO) Take 2-3 tablets by mouth at bedtime.    Multiple Vitamin (MULTIVITAMIN) tablet Take 1 tablet by mouth daily.    doxycycline (VIBRA-TABS) 100 MG tablet Take 1 tablet (100 mg total) by mouth 2 (two) times daily. Qty: 20 tablet, Refills: 0   Associated Diagnoses: Fever, unspecified; Headache(784.0); Right maxillary sinusitis       No Known Allergies    The results of significant diagnostics from this hospitalization (including  imaging, microbiology, ancillary and laboratory) are listed below for reference.    Significant Diagnostic Studies: Ct Abdomen Pelvis W Wo Contrast  02/12/2015   CLINICAL DATA:  69 year old male with right upper quadrant abdominal pain since February 2016. Elevated AST and ALT, with jaundice. Dark urine. Evaluate for potential malignancy.  EXAM: CT ABDOMEN AND PELVIS WITHOUT AND WITH CONTRAST  TECHNIQUE: Multidetector CT imaging of the abdomen and pelvis was performed following the standard protocol before and following the bolus administration of intravenous contrast.  CONTRAST:  125 mL of Isovue-300.  COMPARISON:  No priors.  Abdominal ultrasound 02/09/2015.  FINDINGS: Lower chest: 3.0 x 3.6 x 3.0 cm right lower lobe mass (image 12 of series 9). Multiple other smaller pulmonary nodules scattered throughout the lungs bilaterally. Trace bilateral pleural effusions layering dependently. Right hilar lymphadenopathy measuring up to 1.5 x 2.0 cm (image 4 of series 5). Multiple borderline enlarged and mildly enlarged juxta phrenic lymph nodes bilaterally measuring up to 1 cm in short axis adjacent to the right hemidiaphragm.  Hepatobiliary: Moderate intrahepatic biliary ductal dilatation. Common bile duct does not appear dilated, but is poorly visualized. Gallbladder is largely decompressed. Gallbladder wall appears thickened and edematous, measuring up to 1 cm in thickness. No definite gallstones. Hypoenhancement throughout the left lobe of the liver, presumably related to left portal vein thrombosis (see discussion below). Ill-defined soft tissue in the hepatic hilum adjacent to the centrally dilated bile ducts, concerning for an ill-defined infiltrative  mass such as a cholangiocarcinoma. Some of this tissue appears to extend caudally into the porta hepatis coming in close contact to the pancreatic head, best appreciated on images 63 through 66 of series 5.  Pancreas: No definite pancreatic mass. However, the tail  of the pancreas appears slightly more bulbous than the rest of the gland, and appears to be slightly hypoenhancing compared to the rest of the gland. No pancreatic ductal dilatation. No pancreatic or peripancreatic fluid or inflammatory changes.  Spleen: Unremarkable.  Adrenals/Urinary Tract: Tiny nonobstructive calculi in the collecting systems of the kidneys bilaterally, largest of which measures 5 mm in the lower pole collecting system of the left kidney. No ureteral stones or findings of urinary tract obstruction are noted at this time. No suspicious appearing cystic or solid renal lesions. Urinary bladder is normal in appearance.  Stomach/Bowel: The appearance of the stomach is normal. No pathologic dilatation of small bowel or colon. Normal appendix.  Vascular/Lymphatic: Main portal vein is mildly dilated measuring 17 mm in diameter. The main portal vein and right branches of the portal vein are patent. The left branch of portal vein is not visualized, and appears to be thrombosed. Atherosclerosis throughout the abdominal and pelvic vasculature, with mild aneurysmal dilatation of the right common iliac artery which measures up to 1.8 cm in diameter. Enlarged hepatoduodenal ligament lymph node measuring up to 12 mm in short axis. Several borderline enlarged and mildly enlarged retroperitoneal lymph nodes are noted, largest of which measure up to 12 mm in short axis in the upper retroperitoneum in the para-aortic and aortocaval nodal stations.  Reproductive: Prostate gland and seminal vesicles are unremarkable in appearance.  Other: Small volume of ascites predominantly in the low anatomic pelvis. No pneumoperitoneum. Small umbilical hernia containing omental fat.  Musculoskeletal: There are no aggressive appearing lytic or blastic lesions noted in the visualized portions of the skeleton.  IMPRESSION: 1. Highly unusual appearance of the liver, with moderate to severe intrahepatic biliary ductal dilatation, with  poorly defined soft tissue in the central aspect of the liver, concerning for potential infiltrative neoplasm such ass cholangiocarcinoma. Correlation with MRI/MRCP with and without IV gadolinium is recommended. 2. Hepatoduodenal ligament lymphadenopathy and retroperitoneal lymphadenopathy, concerning for metastatic disease. 3. Left portal vein thrombosis with hypoperfusion throughout the left lobe of the liver. 4. The tail of the pancreas appears slightly enlarged, and enhances to a lesser extent than the remainder of the head and body of the pancreas. No discrete mass is identified. Attention to this region at time of follow-up MRI is recommended to exclude the possibility of a subtle infiltrative neoplasm. 5. Innumerable small pulmonary nodules, and larger right lower lobe pulmonary mass measuring 3.0 x 3.6 x 3.0 cm. Findings are favored to reflect metastatic disease to the lungs, however, the possibility of a primary right lower lobe bronchogenic neoplasm is not excluded. As part of the workup for the patient's underlying primary malignancy and to define the full extent of metastatic disease, these findings could be further evaluated with PET-CT and/or biopsy if clinically appropriate. 6. Additional incidental findings, as above.   Electronically Signed   By: Vinnie Langton M.D.   On: 02/12/2015 15:25   Dg Thoracic Spine 2 View  02/13/2015   CLINICAL DATA:  Thoracic pain. Unknown primary malignancy with likely lung metastases. No injury.  EXAM: THORACIC SPINE - 2 VIEW  COMPARISON:  None.  FINDINGS: Vertebral body alignment, heights and disc spaces are within normal. There is mild spondylosis throughout the  thoracic spine. Pedicles are intact. There is no compression fracture or subluxation.  IMPRESSION: Mild spondylosis.  No acute findings.   Electronically Signed   By: Marin Olp M.D.   On: 02/13/2015 17:26   Ct Head Wo Contrast  02/13/2015   CLINICAL DATA:  Headaches.  Undiagnosed primary malignancy.   EXAM: CT HEAD WITHOUT CONTRAST  TECHNIQUE: Contiguous axial images were obtained from the base of the skull through the vertex without intravenous contrast.  COMPARISON:  None.  FINDINGS: Ventricles, cisterns and other CSF spaces are within normal. There is chronic ischemic microvascular disease. There is no mass, mass effect, shift of midline structures or acute hemorrhage. No evidence to suggest acute infarction. Mild bulbous prominence to the basilar tip as cannot exclude a small aneurysm. Near complete opacification of the right maxillary sinus. Remaining bony structures are within normal.  IMPRESSION: No acute intracranial findings.  Chronic ischemic microvascular disease.  Bulbous prominence to the basilar artery tip as cannot exclude a small aneurysm. Consider CTA versus MRA for further evaluation.  Moderate chronic versus acute sinus inflammatory disease involving the right maxillary sinus.   Electronically Signed   By: Marin Olp M.D.   On: 02/13/2015 17:36   US Abdomen Complete  02/09/2015   CLINICAL DATA:  Right upper quadrant abdominal pain, jaundice, dark urine, no abdominal pain  EXAM: ULTRASOUND ABDOMEN COMPLETE  COMPARISON:  None  FINDINGS: Gallbladder: No gallstones or wall thickening visualized. No sonographic Murphy sign noted.  Common bile duct: Diameter: 4.8 mm  Liver: The hepatic echotexture is coarse. There is no focal mass. Minimal intrahepatic ductal dilation is present.  IVC: Partially obscured by bowel gas.  Pancreas: Completely obscured by bowel gas.  Spleen: Size and appearance within normal limits.  Right Kidney: Length: 11.6 cm. Echogenicity within normal limits. No mass or hydronephrosis visualized.  Left Kidney: Length: 13.5 cm. Echogenicity within normal limits. No mass or hydronephrosis visualized.  Abdominal aorta: No aneurysm visualized.  Other findings: There is no ascites.  IMPRESSION: 1. Heterogeneous hepatic echotexture with intrahepatic ductal dilation. There is no  ultrasonic abnormality of the gallbladder or common bile duct. 2. The pancreas is obscured by bowel gas. Further evaluation with abdominal and pelvic CT scanning is recommended. 3. The spleen, kidneys, and abdominal aorta are unremarkable.   Electronically Signed   By: David  Martinique   On: 02/09/2015 15:27   Mr 3d Recon At Scanner  02/14/2015   CLINICAL DATA:  Evaluate for cholangiocarcinoma. Jaundice and elevated LFTs.  EXAM: MRI ABDOMEN WITHOUT AND WITH CONTRAST (INCLUDING MRCP)  TECHNIQUE: Multiplanar multisequence MR imaging of the abdomen was performed both before and after the administration of intravenous contrast. Heavily T2-weighted images of the biliary and pancreatic ducts were obtained, and three-dimensional MRCP images were rendered by post processing.  CONTRAST:  65mL MULTIHANCE GADOBENATE DIMEGLUMINE 529 MG/ML IV SOLN  COMPARISON:  02/12/2015  FINDINGS: Lower chest: Small left pleural effusion noted. Mass in the right hepatic lobe is noted, worrisome for metastatic disease.  Hepatobiliary: Abnormal signal is identified throughout the entire left lobe of liver. Exact margins of the obstructing tumor are difficult to identify. Central tumor involvement involves the biliary confluence resulting and obstruction of both the right and left hepatic ducts. The common bile duct appears to have a normal caliber. The main portal vein is patent. The portal venous branches to the right hepatic lobe are also patent. There is nonvisualization of the portal venous branches to the left lobe which are presumably  occluded by tumor.  Pancreas: Normal appearance of the pancreas.  Spleen: Normal appearance of the spleen.  Adrenals/Urinary Tract: The adrenal glands are both normal. Unremarkable appearance of the right kidney. Several small cysts are noted within the upper pole of the left kidney  Stomach/Bowel: The stomach appears normal. The visualized upper abdominal bowel loops appear within normal limits. No evidence  for bowel obstruction.  Vascular/Lymphatic: Normal caliber of the abdominal aorta. There are multiple prominent upper abdominal lymph nodes. Pre aortic lymph node measured 1.3 cm, image 61 of series 12004. Periaortic lymph node measures 1.2 cm, image 71 of series 12004. Aortocaval lymph node is enlarged measuring 1.3 cm, image 54 of series 12004.  Other: No significant ascites identified within the upper abdomen. Peritoneal nodularity is identified within the left side of abdomen,, image number 47 of series 12003. Additionally, there is nodular thickening along the left pericolic gutter  Musculoskeletal: Normal signal is identified from within the bone marrow.  IMPRESSION: Exam detail is markedly diminished secondary to patient's body habitus and respiratory motion artifact.  1. Infiltrative tumor involving the left hepatic lobe noted. The margins of this tumor difficult to identify and measure. Tumor and extends centrally to obstruct both the right and left hepatic ducts at the confluence. There also appears to be occlusion of the left hepatic lobe branch of the portal vein. 2. Nodularity within the peritoneum is worrisome for trans peritoneal spread of tumor. 3. Enlarged upper abdominal lymph nodes worrisome for metastatic adenopathy. 4. Suspect pulmonary metastasis with right lower lobe lung mass.   Electronically Signed   By: Kerby Moors M.D.   On: 02/14/2015 19:22   Dg Ercp Biliary & Pancreatic Ducts  02/17/2015   CLINICAL DATA:  69 year old male with a history of cholelithiasis.  EXAM: ERCP  TECHNIQUE: Multiple spot images obtained with the fluoroscopic device and submitted for interpretation post-procedure.  FLUOROSCOPY TIME:  If the device does not provide the exposure index:  Fluoroscopy Time:  Op note  Number of Acquired Images:  8  COMPARISON:  MRI 02/14/2015, fluoroscopy study 02/16/2015, CT 02/12/2015  FINDINGS: Limited fluoroscopic images during ERCP.  Endoscope projects over the upper abdomen.  There is cannulation of the ampulla with small amount of retrograde infusion of contrast opacifying the distal common bile duct.  A guidewire is present within the ductal system.  No significant opacification of the biliary ducts, with a small amount of contrast appearing to enter the cystic duct, gallbladder, and the intrahepatic ducts.  IMPRESSION: ERCP demonstrates partial opacification of the extrahepatic biliary ductal system and gallbladder.  Please refer to the dictated operative report for full details of intraoperative findings and procedure.  Signed,  Dulcy Fanny. Earleen Newport, DO  [facility]  [facility]   Electronically Signed   By: Corrie Mckusick D.O.   On: 02/17/2015 17:15   Mr Abd W/wo Cm/mrcp  02/14/2015   CLINICAL DATA:  Evaluate for cholangiocarcinoma. Jaundice and elevated LFTs.  EXAM: MRI ABDOMEN WITHOUT AND WITH CONTRAST (INCLUDING MRCP)  TECHNIQUE: Multiplanar multisequence MR imaging of the abdomen was performed both before and after the administration of intravenous contrast. Heavily T2-weighted images of the biliary and pancreatic ducts were obtained, and three-dimensional MRCP images were rendered by post processing.  CONTRAST:  50mL MULTIHANCE GADOBENATE DIMEGLUMINE 529 MG/ML IV SOLN  COMPARISON:  02/12/2015  FINDINGS: Lower chest: Small left pleural effusion noted. Mass in the right hepatic lobe is noted, worrisome for metastatic disease.  Hepatobiliary: Abnormal signal  is identified throughout the entire left lobe of liver. Exact margins of the obstructing tumor are difficult to identify. Central tumor involvement involves the biliary confluence resulting and obstruction of both the right and left hepatic ducts. The common bile duct appears to have a normal caliber. The main portal vein is patent. The portal venous branches to the right hepatic lobe are also patent. There is nonvisualization of the portal venous branches to the left lobe which are  presumably occluded by tumor.  Pancreas: Normal appearance of the pancreas.  Spleen: Normal appearance of the spleen.  Adrenals/Urinary Tract: The adrenal glands are both normal. Unremarkable appearance of the right kidney. Several small cysts are noted within the upper pole of the left kidney  Stomach/Bowel: The stomach appears normal. The visualized upper abdominal bowel loops appear within normal limits. No evidence for bowel obstruction.  Vascular/Lymphatic: Normal caliber of the abdominal aorta. There are multiple prominent upper abdominal lymph nodes. Pre aortic lymph node measured 1.3 cm, image 61 of series 12004. Periaortic lymph node measures 1.2 cm, image 71 of series 12004. Aortocaval lymph node is enlarged measuring 1.3 cm, image 54 of series 12004.  Other: No significant ascites identified within the upper abdomen. Peritoneal nodularity is identified within the left side of abdomen,, image number 47 of series 12003. Additionally, there is nodular thickening along the left pericolic gutter  Musculoskeletal: Normal signal is identified from within the bone marrow.  IMPRESSION: Exam detail is markedly diminished secondary to patient's body habitus and respiratory motion artifact.  1. Infiltrative tumor involving the left hepatic lobe noted. The margins of this tumor difficult to identify and measure. Tumor and extends centrally to obstruct both the right and left hepatic ducts at the confluence. There also appears to be occlusion of the left hepatic lobe branch of the portal vein. 2. Nodularity within the peritoneum is worrisome for trans peritoneal spread of tumor. 3. Enlarged upper abdominal lymph nodes worrisome for metastatic adenopathy. 4. Suspect pulmonary metastasis with right lower lobe lung mass.   Electronically Signed   By: Kerby Moors M.D.   On: 02/14/2015 19:22   Ir Percutaneous Transhepatic Cholangiogram  02/16/2015   INDICATION: Obstructive, presumably malignant jaundice with  ill-defined mass nearly replacing the left lobe of the liver resulting in mild centralized intrahepatic biliary duct dilatation within the right lobe of the liver.  Prolonged discussions were held with referring gastroenterologist, Dr. Watt Climes, who felt preceding with ERCP would be of limited utility secondary to the hilar location of the obstructive mass. As such, request was made for placement of a percutaneous biliary drainage catheter.  EXAM: ULTRASOUND AND FLUOROSCOPIC GUIDED PERCUTANEOUS TRANSHEPATIC CHOLANGIOGRAM AND BILIARY TUBE PLACEMENT  COMPARISON:  CT abdomen pelvis - 02/12/2015; MRCP - 02/14/2015  MEDICATIONS: Zosyn 3.375 G IV; the antibiotic was administered within an appropriate time frame prior to skin puncture.  CONTRAST:  86mL OMNIPAQUE IOHEXOL 300 MG/ML  SOLN  ANESTHESIA/SEDATION: Versed 6.5 mg IV; fentanyl 100 mcg IV  Total Moderate Sedation Time  77 minutes  FLUOROSCOPY TIME:  19 minutes, 6 seconds (1688 mGy).  COMPLICATIONS: None immediate  TECHNIQUE: Informed written consent was obtained from the patient and the patient's family after a discussion of the risks, benefits and alternatives to treatment. Questions regarding the procedure were encouraged and answered. A timeout was performed prior to the initiation of the procedure.  The right upper abdominal quadrant was prepped and draped in the usual sterile fashion, and a sterile drape was applied covering the operative  field. Maximum barrier sterile technique with sterile gowns and gloves were used for the procedure. A timeout was performed prior to the initiation of the procedure.  Ultrasound scanning of the right upper abdominal quadrant was performed to delineate the anatomy and avoid transgression of the gallbladder or the pleural. A spot along the right mid axillary line was marked fluoroscopically inferior to the right costophrenic angle.  At the overlying soft tissues were anesthetized with 1% lidocaine with epinephrine, multiple passes  were made with a 21 gauge Chiba needle directed towards the central aspect of the right lobe of the liver both with ultrasound and fluoroscopic guidance, however the biliary tree could never be adequately opacified. As such, a biliary drainage catheter was not placed.  The patient tolerated the above attempted procedure well without immediate postprocedural complication.  FINDINGS: Ultrasound scanning failed to delineate significant dilatation of the right biliary tree.  Despite multiple attempts, both with ultrasound and fluoroscopic guidance, the biliary tree could not be adequately opacified and as such, a biliary drain could not be placed from a percutaneous approach.  Note, the examination with markedly degraded secondary to patient body habitus as well as significant respiratory excursion.  IMPRESSION: Attempted though ultimately unsuccessful placement of a percutaneous biliary tree due to failure to apically opacified the very mildly dilated right intrahepatic biliary system.  PLAN: Above was discussed with consultation gastroenterologist, Dr. Watt Climes, will attempt to perform biliary stent placement with potential biopsy via an endoscopic approach later this week.  If endoscopic approach also proves unsuccessful, would consider repeat percutaneous biliary drainage catheter placement with general anesthesia.   Electronically Signed   By: Sandi Mariscal M.D.   On: 02/16/2015 16:23    Microbiology: No results found for this or any previous visit (from the past 240 hour(s)).   Labs: Basic Metabolic Panel:  Recent Labs Lab 02/14/15 0408 02/16/15 0425 02/17/15 0436 02/18/15 0353 02/19/15 0402  NA 138 136 136 139 138  K 3.2* 3.1* 3.4* 3.4* 3.3*  CL 103 105 107 106 108  CO2 26 23 22 23 24   GLUCOSE 130* 122* 122* 125* 119*  BUN 10 7 9 10 9   CREATININE 0.49* 0.37* 0.44* 0.53 0.44*  CALCIUM 8.4 8.0* 8.3* 8.2* 8.0*  MG  --  2.0  --   --   --    Liver Function Tests:  Recent Labs Lab 02/15/15 1210  02/16/15 0425 02/17/15 0436 02/18/15 0353 02/19/15 0402  AST 173* 155* 158* 155* 137*  ALT 243* 220* 222* 193* 175*  ALKPHOS 727* 672* 590* 594* 634*  BILITOT 14.9* 14.1* 14.0* 16.0* 17.6*  PROT 6.0 5.9* 5.5* 5.3* 5.3*  ALBUMIN 2.6* 2.4* 2.3* 2.2* 2.2*    Recent Labs Lab 02/13/15 1801  LIPASE 110*   No results for input(s): AMMONIA in the last 168 hours. CBC:  Recent Labs Lab 02/13/15 1801 02/14/15 0408 02/15/15 0426 02/16/15 0425 02/17/15 0436 02/18/15 0353  WBC 9.6 9.3 8.4 8.9 8.9 10.2  NEUTROABS 7.8*  --   --   --   --   --   HGB 12.1* 11.6* 10.9* 10.9* 10.0* 9.8*  HCT 37.0* 34.9* 32.9* 33.0* 31.0* 30.3*  MCV 93.0 92.8 92.4 92.7 93.1 94.4  PLT 351 324 316 323 293 339   Cardiac Enzymes: No results for input(s): CKTOTAL, CKMB, CKMBINDEX, TROPONINI in the last 168 hours. BNP: BNP (last 3 results) No results for input(s): BNP in the last 8760 hours.  ProBNP (last 3 results) No results for  input(s): PROBNP in the last 8760 hours.  CBG: No results for input(s): GLUCAP in the last 168 hours.     Signed:  Gabryella Murfin A  [facility] 02/19/2015, 1:55 PM

## 2015-02-19 NOTE — Progress Notes (Signed)
   02/19/15 1600  Clinical Encounter Type  Visited With Patient and family together  Visit Type Initial;Psychological support;Spiritual support  Referral From Nurse  Spiritual Encounters  Spiritual Needs Emotional (Will follow up for conitnued emotional and spiritual support)  Stress Factors  Patient Stress Factors Major life changes (new diagnosis)  Family Stress Factors Major life changes (new diagnosis)   Nursing referral.  Chaplain introduced Spiritual care as resource.  Pt and spouse present at beginning of encounter, extended family arrived during encounter.   Spouse and patient engaged in life review with chaplain, discussing grand children and influence they hope to have on them.  Began to discuss emotional impact of diagnosis when extended family with children arrived.  Conversation shifted and chaplain did not provide further support around emotional impact of diagnosis, as family is being selective about what children know and how they find out.  Provided support with extended family and will follow up for continued support around new diagnosis / transfer / treatment.    [place], [place]

## 2015-03-08 DEATH — deceased

## 2016-09-08 IMAGING — CT CT HEAD W/O CM
2 series · 15 of 30 positions shown, 19 images · non-contrast
Comparison: None.

CLINICAL DATA: Headaches.  Undiagnosed primary malignancy.

EXAM:
CT HEAD WITHOUT CONTRAST
TECHNIQUE: Contiguous axial images were obtained from the base of the skull
through the vertex without intravenous contrast.

[Series 2: head w/o · axial · non-contrast · 0.47mm/px · z∈[+1394,+1554]mm · 13 of 38 slices shown, 17 images]
[im 3/38  brain]
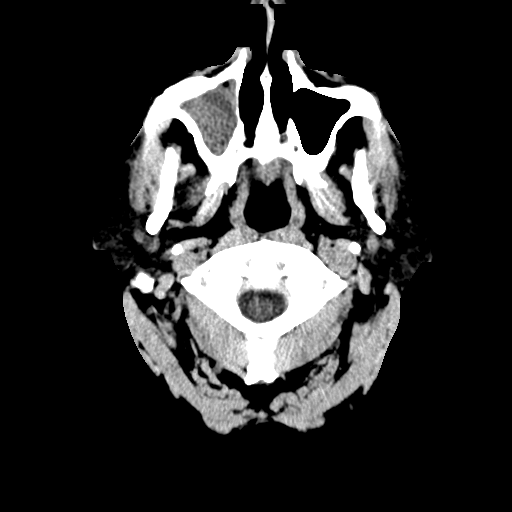
[im 3/38  bone]
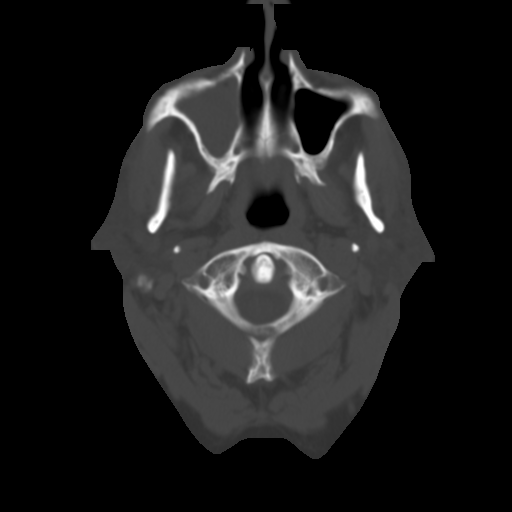
[im 6/38  brain]
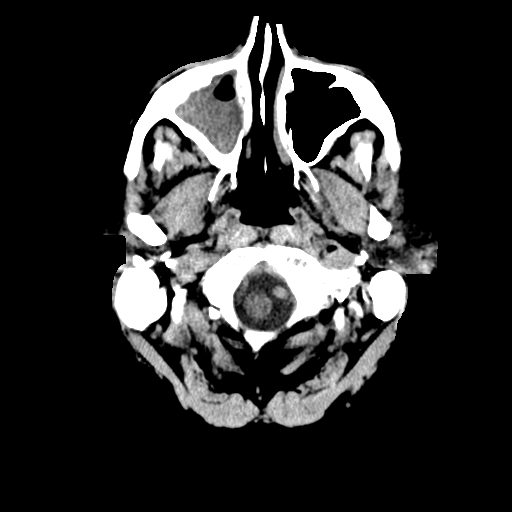
[im 8/38  brain]
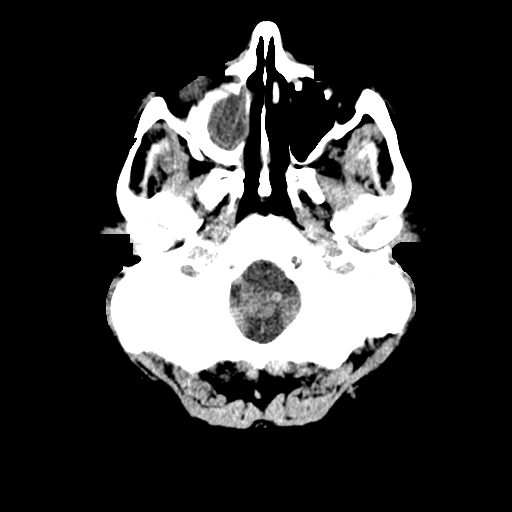
[im 11/38  brain]
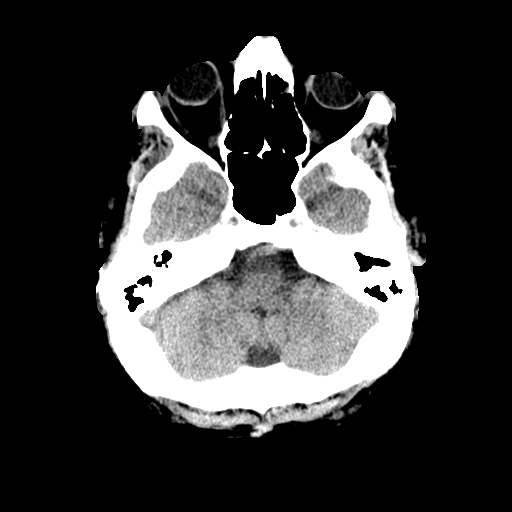
[im 14/38  brain]
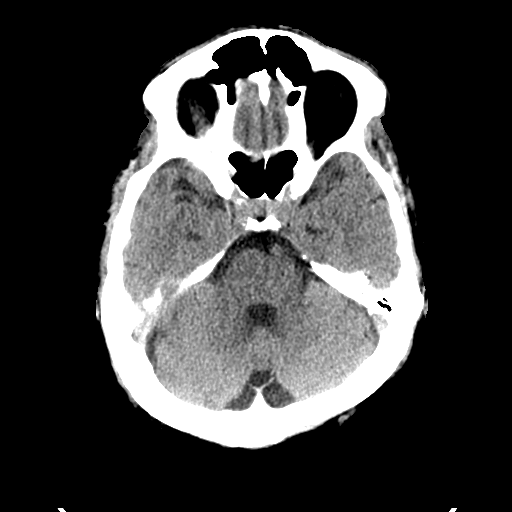
[im 14/38  bone]
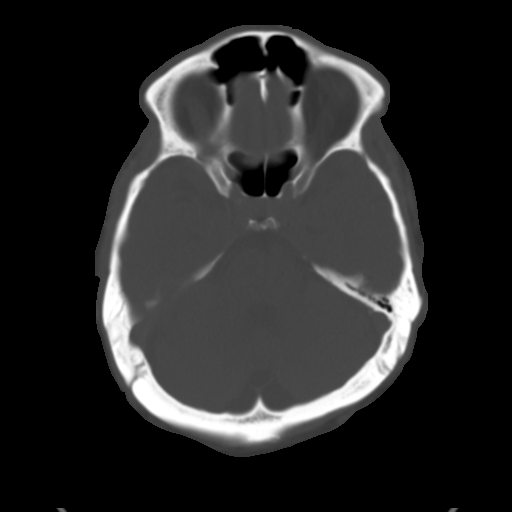
[im 16/38  brain]
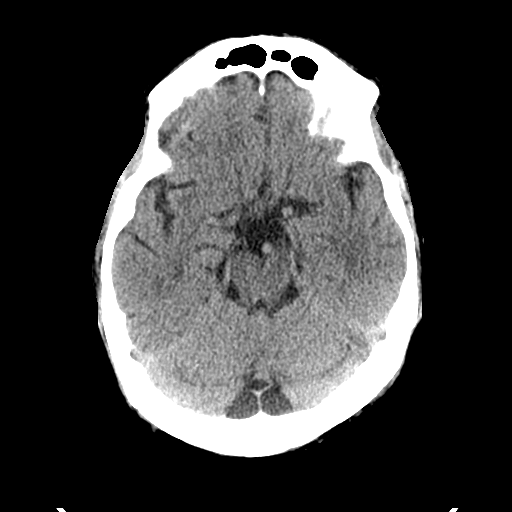
[im 19/38  brain]
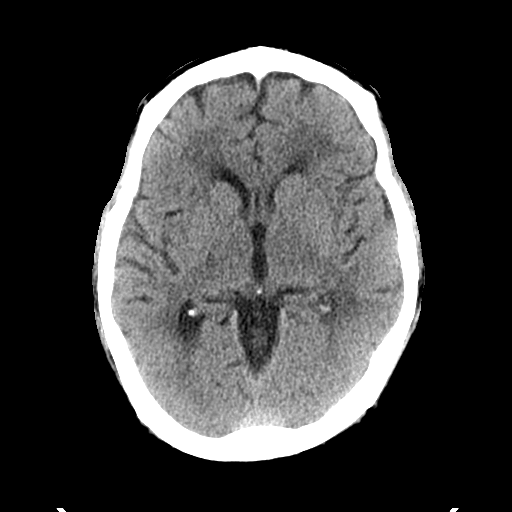
[im 22/38  brain]
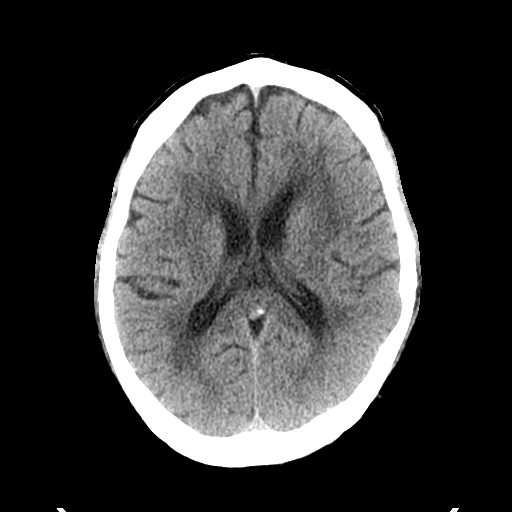
[im 24/38  brain]
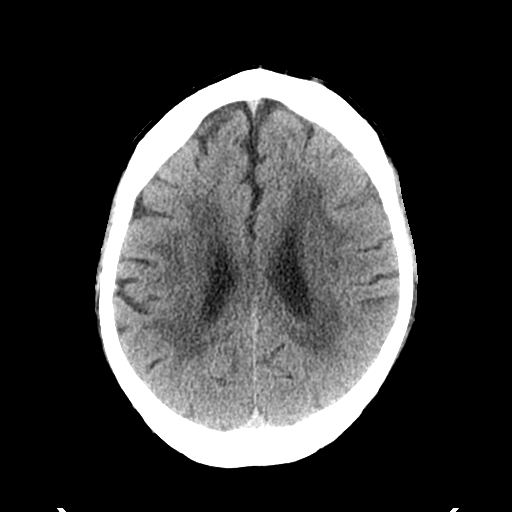
[im 24/38  bone]
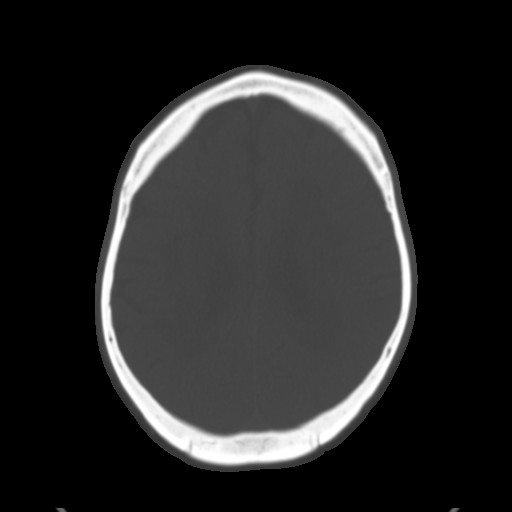
[im 27/38  brain]
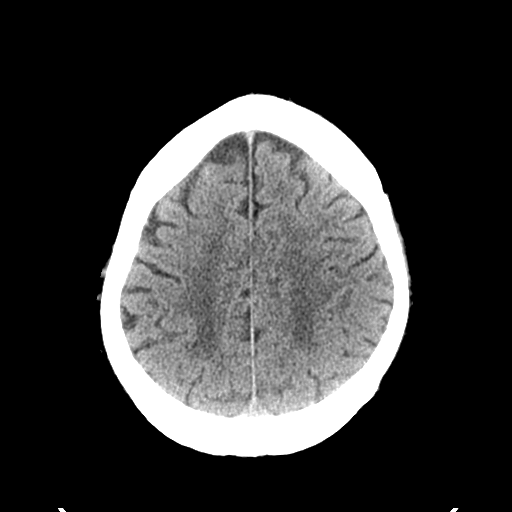
[im 30/38  brain]
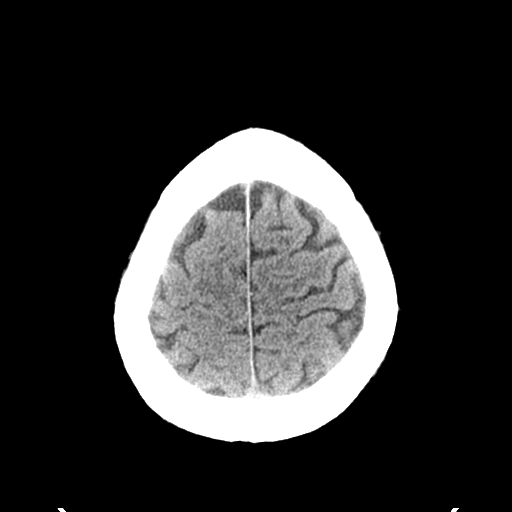
[im 32/38  brain]
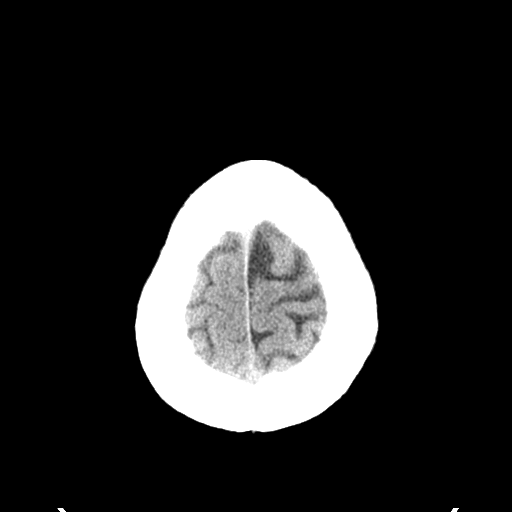
[im 35/38  brain]
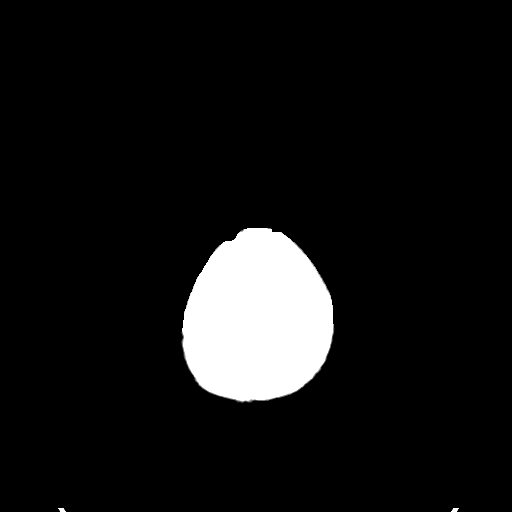
[im 35/38  bone]
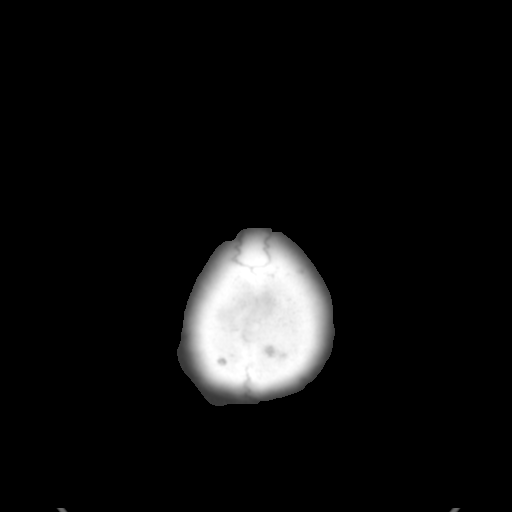

[Series 3: bone windows · axial · 0.47mm/px · z∈[+1394,+1419]mm · 2 of 38 slices shown]
[im 3/38  bone]
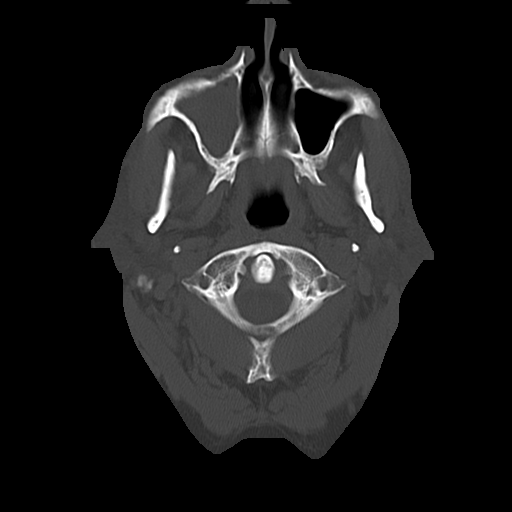
[im 8/38  bone]
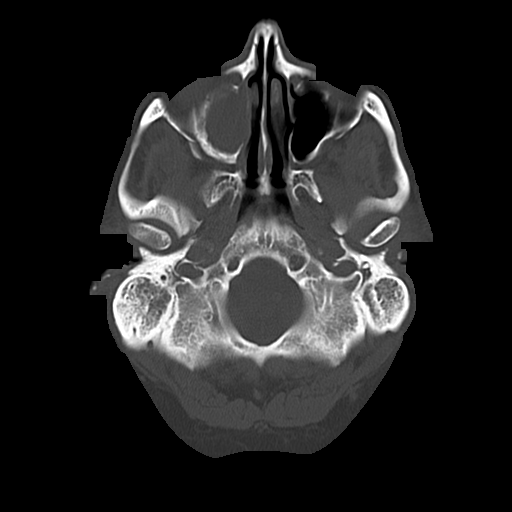

[15 of 30 positions shown; findings below may reference images not displayed]

FINDINGS: Ventricles, cisterns and other CSF spaces are within normal. There
is chronic ischemic microvascular disease. There is no mass, mass
effect, shift of midline structures or acute hemorrhage. No evidence
to suggest acute infarction. Mild bulbous prominence to the basilar
tip as cannot exclude a small aneurysm. Near complete opacification
of the right maxillary sinus. Remaining bony structures are within
normal.
IMPRESSION: No acute intracranial findings.

Chronic ischemic microvascular disease.

Bulbous prominence to the basilar artery tip as cannot exclude a
small aneurysm. Consider CTA versus MRA for further evaluation.

Moderate chronic versus acute sinus inflammatory disease involving
the right maxillary sinus.
